# Patient Record
Sex: Female | Born: 1985 | Race: White | Hispanic: No | Smoking: Current some day smoker
Health system: Southern US, Community
[De-identification: ages and names within clinical notes are randomized; demographics above are authoritative.]

## PROBLEM LIST (undated history)

## (undated) DIAGNOSIS — F32A Depression, unspecified: Secondary | ICD-10-CM

## (undated) DIAGNOSIS — F329 Major depressive disorder, single episode, unspecified: Secondary | ICD-10-CM

## (undated) DIAGNOSIS — K5909 Other constipation: Secondary | ICD-10-CM

## (undated) DIAGNOSIS — Z862 Personal history of diseases of the blood and blood-forming organs and certain disorders involving the immune mechanism: Secondary | ICD-10-CM

---

## 2009-05-13 ENCOUNTER — Emergency Department (HOSPITAL_BASED_OUTPATIENT_CLINIC_OR_DEPARTMENT_OTHER): Admission: EM | Admit: 2009-05-13 | Discharge: 2009-05-13 | Payer: Self-pay | Admitting: Emergency Medicine

## 2009-05-13 ENCOUNTER — Ambulatory Visit: Payer: Self-pay | Admitting: Diagnostic Radiology

## 2009-05-13 IMAGING — US US OB TRANSVAGINAL
1 series · 14 of 28 positions shown · non-contrast
Comparison: None

CLINICAL DATA: Positive pregnancy test, right lower quadrant
cramping

OBSTETRIC <14 WK US AND TRANSVAGINAL OB US
TECHNIQUE: Both transabdominal and transvaginal ultrasound
examinations were performed for complete evaluation of the
gestation as well as the maternal uterus, adnexal regions, and
pelvic cul-de-sac.

[Series 1: us ob transvaginal · 0.24mm/px · 14 of 59 slices shown]
[im 3/59]
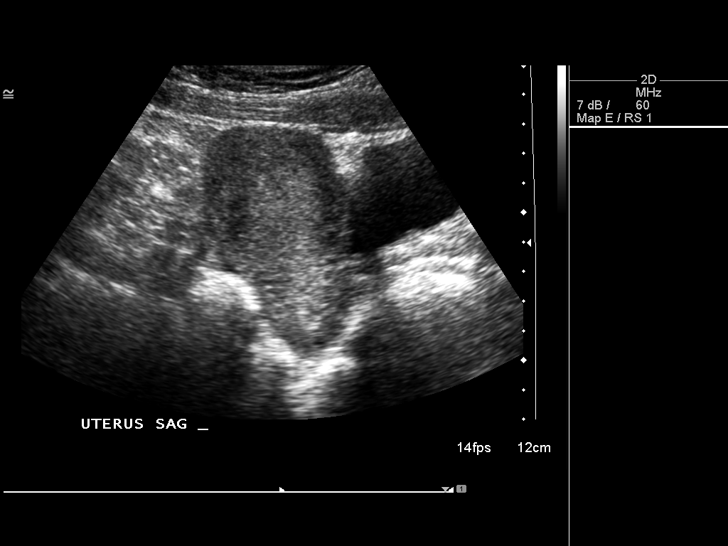
[im 7/59]
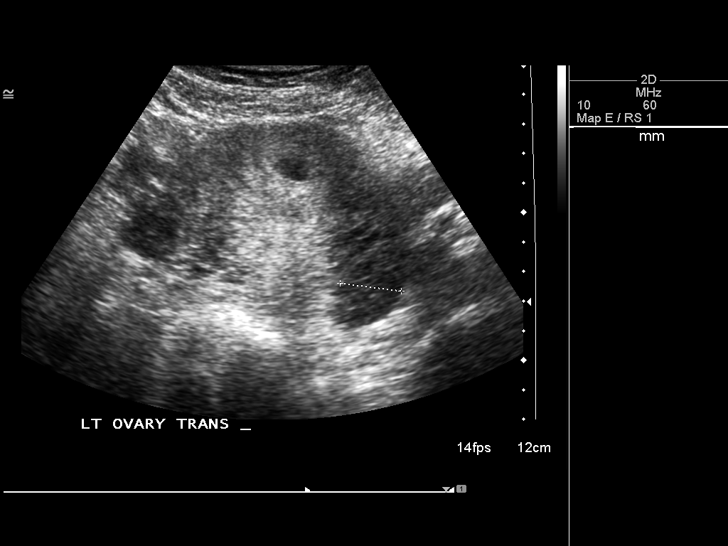
[im 11/59]
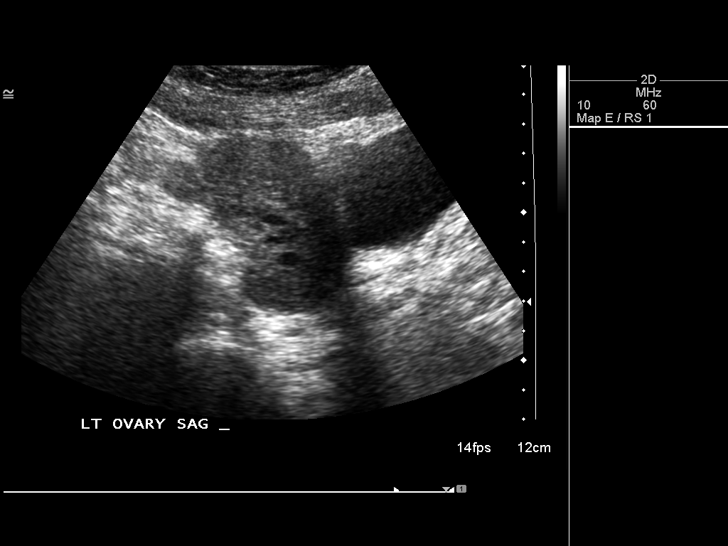
[im 16/59]
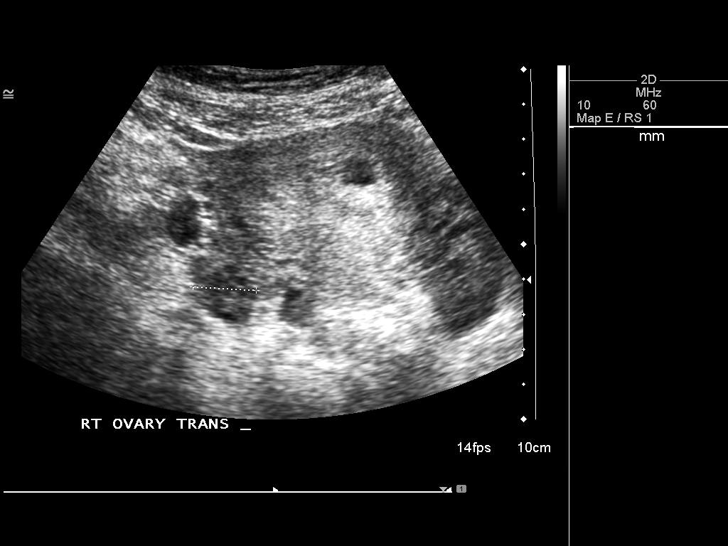
[im 20/59]
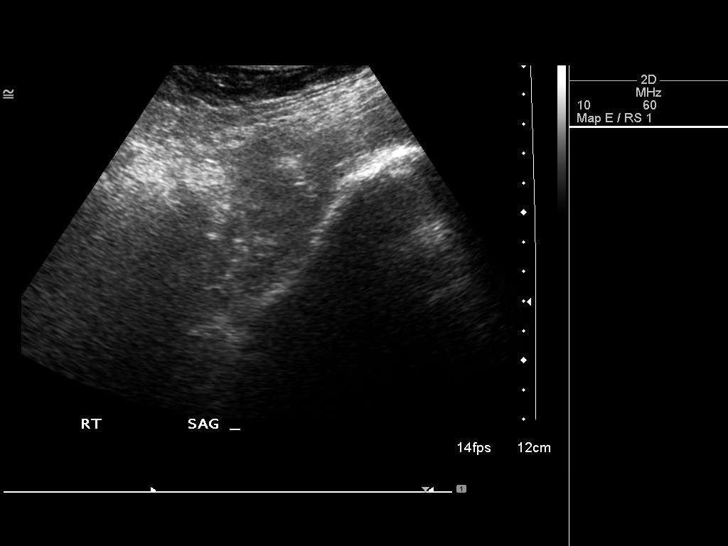
[im 24/59]
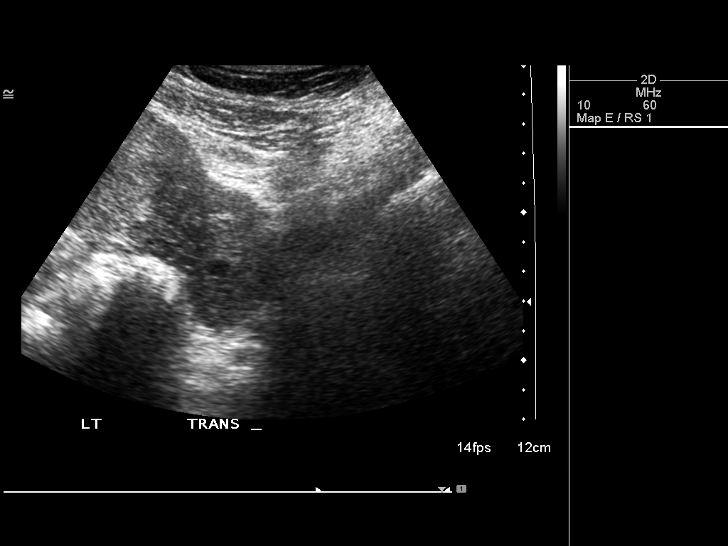
[im 28/59]
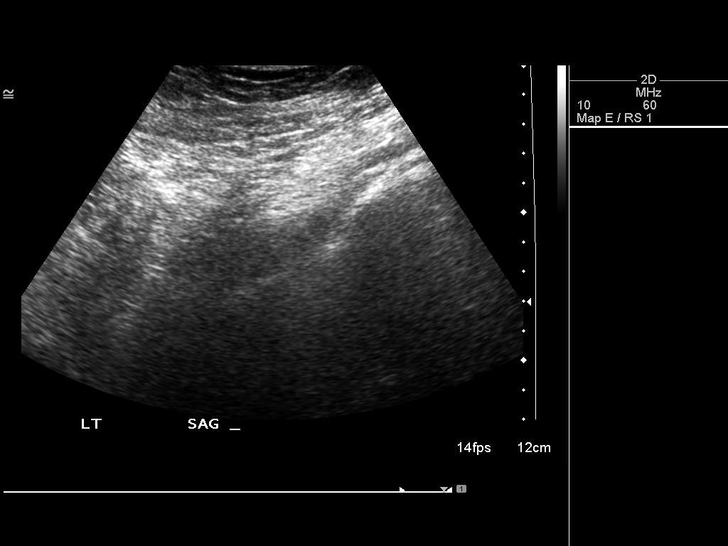
[im 33/59]
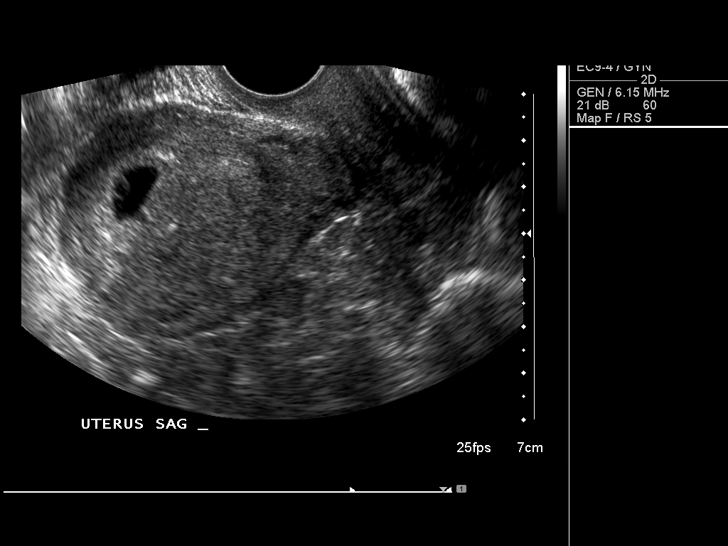
[im 37/59]
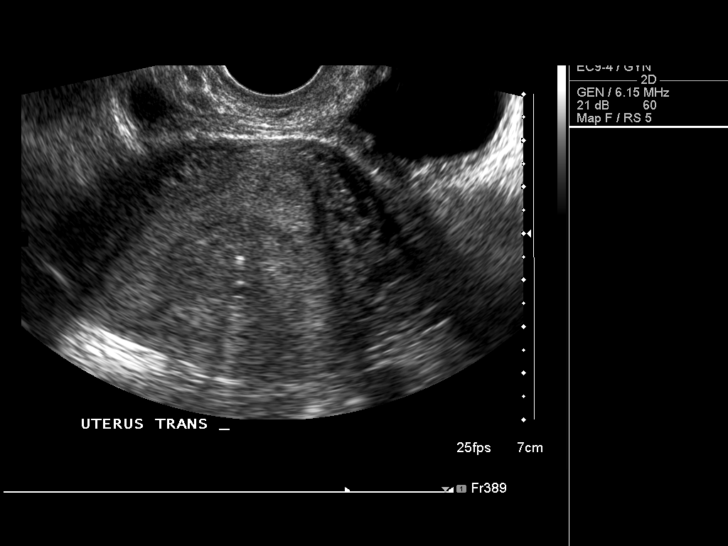
[im 41/59]
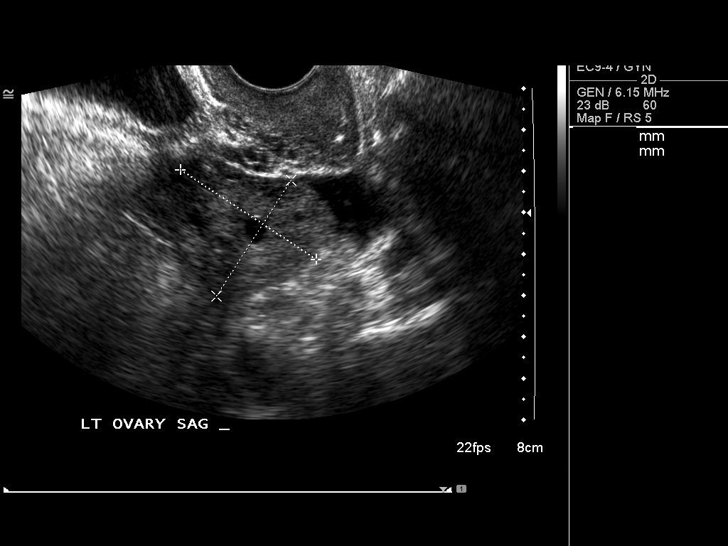
[im 46/59]
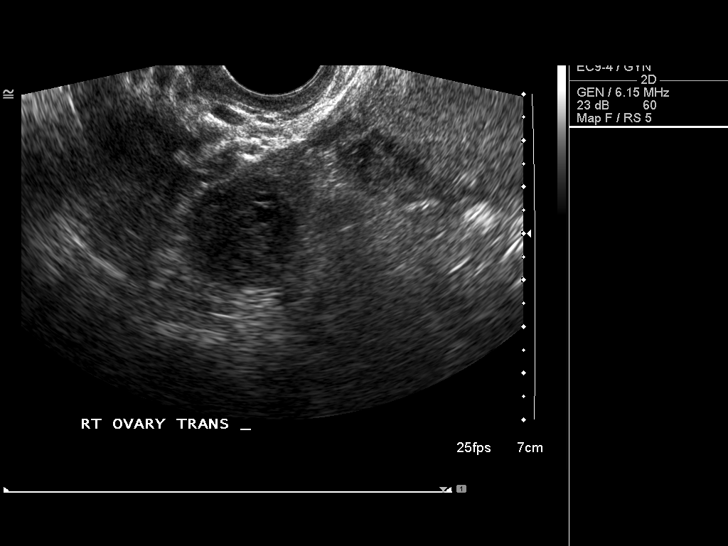
[im 50/59]
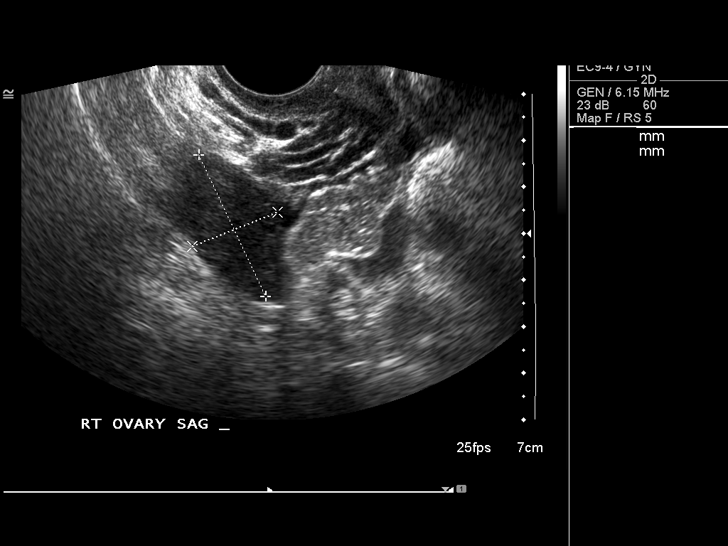
[im 54/59]
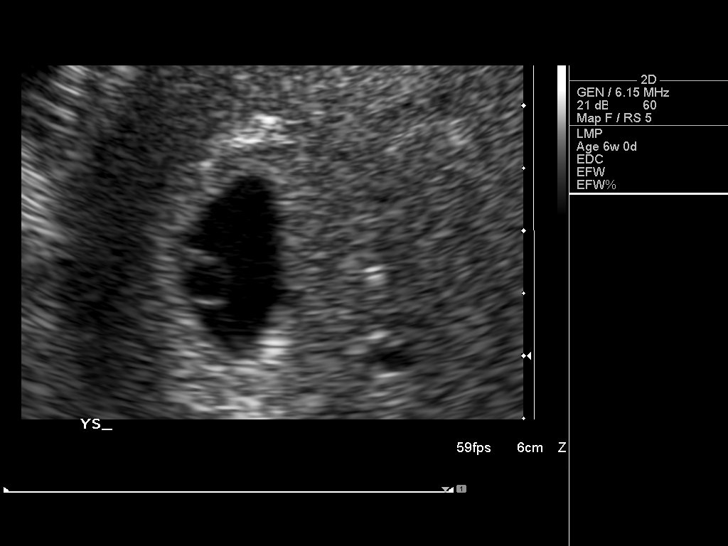
[im 59/59]
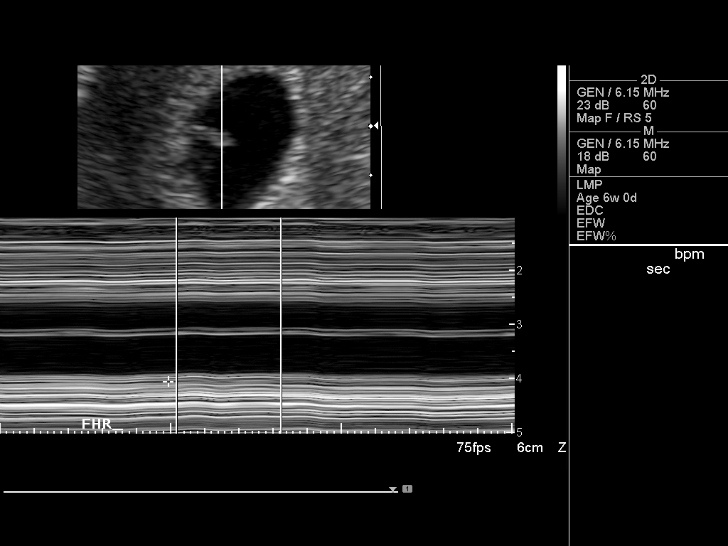

[14 of 28 positions shown; findings below may reference images not displayed]

Intrauterine gestational sac: Present
Yolk sac: Present
Embryo: Present
Cardiac Activity: Present
Heart Rate: 98 bpm

CRL: 5mm           6   w  1   d           US EDC: [DATE]

Maternal uterus/adnexae:
Trace fluid noted around the left adnexa.  Both ovaries are normal.
IMPRESSION: Intrauterine gestational sac, yolk sac, fetal pole, and cardiac
activity.  Concordant dates by LMP and crown-rump length.  No acute
finding.

## 2009-06-22 ENCOUNTER — Emergency Department (HOSPITAL_BASED_OUTPATIENT_CLINIC_OR_DEPARTMENT_OTHER): Admission: EM | Admit: 2009-06-22 | Discharge: 2009-06-22 | Payer: Self-pay | Admitting: Emergency Medicine

## 2009-07-27 DIAGNOSIS — Z862 Personal history of diseases of the blood and blood-forming organs and certain disorders involving the immune mechanism: Secondary | ICD-10-CM

## 2009-07-27 HISTORY — DX: Personal history of diseases of the blood and blood-forming organs and certain disorders involving the immune mechanism: Z86.2

## 2009-09-20 ENCOUNTER — Emergency Department (HOSPITAL_BASED_OUTPATIENT_CLINIC_OR_DEPARTMENT_OTHER): Admission: EM | Admit: 2009-09-20 | Discharge: 2009-09-20 | Payer: Self-pay | Admitting: Emergency Medicine

## 2010-10-30 LAB — CBC
Hemoglobin: 13.2 g/dL (ref 12.0–15.0)
MCHC: 36 g/dL (ref 30.0–36.0)
MCV: 98.6 fL (ref 78.0–100.0)
RBC: 3.71 MIL/uL — ABNORMAL LOW (ref 3.87–5.11)

## 2010-10-30 LAB — URINALYSIS, ROUTINE W REFLEX MICROSCOPIC
Glucose, UA: NEGATIVE mg/dL
Hgb urine dipstick: NEGATIVE
Urobilinogen, UA: 1 mg/dL (ref 0.0–1.0)
pH: 6.5 (ref 5.0–8.0)

## 2010-10-30 LAB — COMPREHENSIVE METABOLIC PANEL
ALT: 25 U/L (ref 0–35)
AST: 23 U/L (ref 0–37)
Calcium: 9.9 mg/dL (ref 8.4–10.5)
Chloride: 103 mEq/L (ref 96–112)
GFR calc non Af Amer: >60 mL/min (ref >60)
Sodium: 140 mEq/L (ref 135–145)
Total Protein: 8.1 g/dL (ref 6.0–8.3)

## 2010-10-30 LAB — LIPASE, BLOOD: Lipase: 118 U/L (ref 23–300)

## 2010-10-30 LAB — DIFFERENTIAL
Basophils Absolute: 0.4 10*3/uL — ABNORMAL HIGH (ref 0.0–0.1)
Eosinophils Relative: 1 % (ref 0–5)
Lymphocytes Relative: 23 % (ref 12–46)
Monocytes Relative: 4 % (ref 3–12)
Neutro Abs: 10.2 10*3/uL — ABNORMAL HIGH (ref 1.7–7.7)

## 2010-10-30 LAB — PREGNANCY, URINE: Preg Test, Ur: POSITIVE

## 2011-01-29 ENCOUNTER — Emergency Department (INDEPENDENT_AMBULATORY_CARE_PROVIDER_SITE_OTHER): Payer: Self-pay

## 2011-01-29 ENCOUNTER — Emergency Department (HOSPITAL_BASED_OUTPATIENT_CLINIC_OR_DEPARTMENT_OTHER)
Admission: EM | Admit: 2011-01-29 | Discharge: 2011-01-29 | Disposition: A | Discharge status: Home / Self Care | Payer: Self-pay | Attending: Emergency Medicine | Admitting: Emergency Medicine

## 2011-01-29 DIAGNOSIS — Y9372 Activity, wrestling: Secondary | ICD-10-CM | POA: Insufficient documentation

## 2011-01-29 DIAGNOSIS — L988 Other specified disorders of the skin and subcutaneous tissue: Secondary | ICD-10-CM

## 2011-01-29 DIAGNOSIS — IMO0002 Reserved for concepts with insufficient information to code with codable children: Secondary | ICD-10-CM | POA: Insufficient documentation

## 2011-01-29 DIAGNOSIS — S8000XA Contusion of unspecified knee, initial encounter: Secondary | ICD-10-CM | POA: Insufficient documentation

## 2011-01-29 DIAGNOSIS — Y92009 Unspecified place in unspecified non-institutional (private) residence as the place of occurrence of the external cause: Secondary | ICD-10-CM | POA: Insufficient documentation

## 2011-01-29 DIAGNOSIS — R609 Edema, unspecified: Secondary | ICD-10-CM

## 2011-01-29 DIAGNOSIS — W19XXXA Unspecified fall, initial encounter: Secondary | ICD-10-CM

## 2011-08-16 ENCOUNTER — Emergency Department (HOSPITAL_BASED_OUTPATIENT_CLINIC_OR_DEPARTMENT_OTHER)
Admission: EM | Admit: 2011-08-16 | Discharge: 2011-08-16 | Disposition: A | Discharge status: Home / Self Care | Payer: Self-pay | Attending: Emergency Medicine | Admitting: Emergency Medicine

## 2011-08-16 ENCOUNTER — Encounter (HOSPITAL_BASED_OUTPATIENT_CLINIC_OR_DEPARTMENT_OTHER): Payer: Self-pay | Admitting: *Deleted

## 2011-08-16 DIAGNOSIS — R197 Diarrhea, unspecified: Secondary | ICD-10-CM | POA: Insufficient documentation

## 2011-08-16 DIAGNOSIS — M549 Dorsalgia, unspecified: Secondary | ICD-10-CM | POA: Insufficient documentation

## 2011-08-16 LAB — URINALYSIS, ROUTINE W REFLEX MICROSCOPIC
Glucose, UA: NEGATIVE mg/dL
Hgb urine dipstick: NEGATIVE
Ketones, ur: NEGATIVE mg/dL
Specific Gravity, Urine: 1.031 — ABNORMAL HIGH (ref 1.005–1.030)

## 2011-08-16 LAB — PREGNANCY, URINE: Preg Test, Ur: NEGATIVE

## 2011-08-16 MED ORDER — HYOSCYAMINE SULFATE CR 0.375 MG PO CP12
0.3750 mg | ORAL_CAPSULE | Freq: Two times a day (BID) | ORAL | Status: DC | PRN
Start: 2011-08-16 — End: 2011-08-16

## 2011-08-16 MED ORDER — HYOSCYAMINE SULFATE CR 0.375 MG PO CP12: 0.3750 mg | ORAL_CAPSULE | Freq: Two times a day (BID) | ORAL | Status: DC | PRN

## 2011-08-16 NOTE — ED Provider Notes (Signed)
History     CSN: [id number]  Arrival date & time 08/16/11  1521   First MD Initiated Contact with Patient 08/16/11 1702      Chief Complaint  Patient presents with  . Diarrhea    (Consider location/radiation/quality/duration/timing/severity/associated sxs/prior treatment) Patient is a 26 y.o. female presenting with diarrhea. The history is provided by the patient. No language interpreter was used.  Diarrhea The primary symptoms include diarrhea. The illness began more than 7 days ago. The problem has been gradually worsening.  The illness is also significant for back pain. The illness does not include chills. Associated medical issues do not include inflammatory bowel disease or diverticulitis.  Pt complains of diarrhea for 2 months.  Pt does not have amedical doctor  History reviewed. No pertinent past medical history.  Past Surgical History  Procedure Date  . Hand surgery     History reviewed. No pertinent family history.  History  Substance Use Topics  . Smoking status: Current Everyday Smoker  . Smokeless tobacco: Not on file  . Alcohol Use: No    OB History    Grav Para Term Preterm Abortions TAB SAB Ect Mult Living                  Review of Systems  Constitutional: Negative for chills.  Gastrointestinal: Positive for diarrhea.  Musculoskeletal: Positive for back pain.  All other systems reviewed and are negative.    Allergies  Review of patient's allergies indicates no known allergies.  Home Medications  No current outpatient prescriptions on file.  BP 142/78  Pulse 98  Temp(Src) 98.2 F (36.8 C) (Oral)  Resp 18  Ht 5\' 7"  (1.702 m)  Wt 165 lb (74.844 kg)  BMI 25.84 kg/m2  SpO2 100%  Physical Exam  Nursing note and vitals reviewed. Constitutional: She is oriented to person, place, and time. She appears well-developed and well-nourished.  HENT:  Head: Normocephalic and atraumatic.  Right Ear: External ear normal.  Left Ear: External ear  normal.  Nose: Nose normal.  Mouth/Throat: Oropharynx is clear and moist.  Eyes: Conjunctivae and EOM are normal. Pupils are equal, round, and reactive to light.  Neck: Normal range of motion. Neck supple.  Cardiovascular: Normal rate and normal heart sounds.   Pulmonary/Chest: Effort normal and breath sounds normal.  Abdominal: Soft.  Musculoskeletal: Normal range of motion.  Neurological: She is alert and oriented to person, place, and time. She has normal reflexes.  Skin: Skin is warm.  Psychiatric: She has a normal mood and affect.    ED Course  Procedures (including critical care time)  Labs Reviewed  URINALYSIS, ROUTINE W REFLEX MICROSCOPIC - Abnormal; Notable for the following:    Specific Gravity, Urine 1.031 (*)    All other components within normal limits  PREGNANCY, URINE   No results found.   No diagnosis found.    MDM   Results for orders placed during the hospital encounter of 08/16/11  URINALYSIS, ROUTINE W REFLEX MICROSCOPIC      Component Value Range   Color, Urine YELLOW  YELLOW    APPearance CLEAR  CLEAR    Specific Gravity, Urine 1.031 (*) 1.005 - 1.030    pH 6.0  5.0 - 8.0    Glucose, UA NEGATIVE  NEGATIVE (mg/dL)   Hgb urine dipstick NEGATIVE  NEGATIVE    Bilirubin Urine NEGATIVE  NEGATIVE    Ketones, ur NEGATIVE  NEGATIVE (mg/dL)   Protein, ur NEGATIVE  NEGATIVE (mg/dL)  Urobilinogen, UA 1.0  0.0 - 1.0 (mg/dL)   Nitrite NEGATIVE  NEGATIVE    Leukocytes, UA NEGATIVE  NEGATIVE   PREGNANCY, URINE      Component Value Range   Preg Test, Ur NEGATIVE     No results found.         Langston Masker, [place] 08/16/11 1801

## 2011-08-16 NOTE — ED Provider Notes (Signed)
History/physical exam/procedure(s) were performed by non-physician practitioner and as supervising physician I was immediately available for consultation/collaboration. I have reviewed all notes and am in agreement with care and plan.   Romello Hoehn S Kaiven Vester, MD 08/16/11 2322 

## 2011-08-16 NOTE — ED Notes (Signed)
Pt states that she has had diarrhea for 2-1/2 months. Some "pressure" in lower abd. Denies dizziness or other s/s.

## 2012-07-26 LAB — OB RESULTS CONSOLE HGB/HCT, BLOOD: Hemoglobin: 12.5 g/dL

## 2012-07-26 LAB — OB RESULTS CONSOLE ABO/RH: RH Type: POSITIVE

## 2012-07-26 LAB — OB RESULTS CONSOLE HIV ANTIBODY (ROUTINE TESTING): HIV: NONREACTIVE

## 2012-07-26 LAB — OB RESULTS CONSOLE ANTIBODY SCREEN: Antibody Screen: NEGATIVE

## 2012-07-26 LAB — OB RESULTS CONSOLE GC/CHLAMYDIA: Gonorrhea: NEGATIVE

## 2012-07-26 LAB — OB RESULTS CONSOLE PLATELET COUNT: Platelets: 188 10*3/uL

## 2012-07-26 LAB — OB RESULTS CONSOLE HEPATITIS B SURFACE ANTIGEN: Hepatitis B Surface Ag: NEGATIVE

## 2012-07-27 NOTE — L&D Delivery Note (Signed)
I was present for birth and agree with fellow's note. Placenta w/ marginal/battledore insertion and cord w/ pseduoknots.  Pt w/o IV access, given 20units pitocin IM after birth.  Plans to breastfeed, interim BTL, no circumcision.   Katherine Weber [place], [place] 02/18/2013 6:51 PM

## 2012-07-27 NOTE — L&D Delivery Note (Signed)
Delivery Note At 4:54 PM a viable female was delivered via Vaginal, Spontaneous Delivery (Presentation: Left Occiput Anterior).  Nuchal x1- unable to reduce on perineum but delivered through and easily reduced after. Spontaneous cry. APGAR: 9, 9; weight TBD .   Placenta status: Intact, Spontaneous.  Cord: 3 vessels with the following complications: None.    Anesthesia: None  Episiotomy: na Lacerations: none Suture Repair: na Est. Blood Loss (mL): 400  Mom to postpartum.  Baby to nursery-stable.  Shivansh Hardaway L MD 02/18/2013, 5:15 PM

## 2012-08-31 ENCOUNTER — Encounter: Payer: Self-pay | Admitting: *Deleted

## 2012-09-02 ENCOUNTER — Encounter: Payer: Self-pay | Admitting: Advanced Practice Midwife

## 2012-09-02 ENCOUNTER — Ambulatory Visit (INDEPENDENT_AMBULATORY_CARE_PROVIDER_SITE_OTHER): Payer: Medicaid Other | Admitting: Advanced Practice Midwife

## 2012-09-02 VITALS — BP 125/80 | Wt 181.0 lb

## 2012-09-02 DIAGNOSIS — Z3480 Encounter for supervision of other normal pregnancy, unspecified trimester: Secondary | ICD-10-CM

## 2012-09-02 DIAGNOSIS — O99330 Smoking (tobacco) complicating pregnancy, unspecified trimester: Secondary | ICD-10-CM

## 2012-09-02 NOTE — Progress Notes (Signed)
p-80  Transfer from [facility]

## 2012-09-02 NOTE — Progress Notes (Signed)
   Subjective:    Katherine Weber is a [id number] [redacted]w[redacted]d being seen today for her first obstetrical visit.  Her obstetrical history is significant for smoker, NSVD x2. Patient does intend to breast feed. Pregnancy history fully reviewed, records from [facility] reviewed.  Patient reports no bleeding, no cramping and no leaking. C/o some nasal congestion.  Filed Vitals:   09/02/12 1033  BP: 125/80  Weight: 181 lb (82.101 kg)    HISTORY: OB History    Grav Para Term Preterm Abortions TAB SAB Ect Mult Living   3 2 2       2      # Outc Date GA Lbr Len/2nd Wgt Sex Del Anes PTL Lv   1 TRM 8/08 [redacted]w[redacted]d  [id number](7.82NF) M SVD None No Yes   2 TRM 6/11 [redacted]w[redacted]d  6lb15oz(3.147kg) M SVD EPI No Yes   Comments: thrombocytopenia in pregnancy   3 CUR              Past Medical History  Diagnosis Date  . Post partum depression   . Thrombocytopenia complicating pregnancy 2011   Past Surgical History  Procedure Date  . Hand surgery    Family History  Problem Relation Age of Onset  . Cancer Maternal Grandmother     Bone and lung  . Diabetes Father   . Hypertension Father   . Heart disease Father   . SIDS Brother      Exam    Uterus:     Pelvic Exam:    Perineum: Not examined   Vulva: Not examined   Vagina:  Not examined   pH:    Cervix: not examined   Adnexa: not evaluated   Bony Pelvis: proven to 6lb15oz  System: Breast:  normal appearance, no masses or tenderness,    Skin: normal coloration and turgor, no rashes    Neurologic: oriented, normal mood, no focal deficits   Extremities: ROM of all joints is normal       Mouth/Teeth mucous membranes moist, pharynx normal without lesions   Neck supple and no masses   Cardiovascular: Not examined   Respiratory:  appears well, vitals normal, no respiratory distress, acyanotic, normal RR, ear and throat exam is normal, neck free of mass or lymphadenopathy   Abdomen: soft, non-tender; bowel sounds normal; no masses,  no organomegaly   Urinary: not examined      Assessment:    Pregnancy: G3P2002 1. Supervision of normal subsequent pregnancy         Plan:     Initial labs reviewed. Prenatal vitamins. Problem list reviewed and updated. Genetic Screening done at [facility], results normal  Ultrasound discussed; fetal survey: ordered.  Follow up in 4 weeks. 50% of 30 min visit spent on counseling and coordination of care.     LEFTW[place]-KIRBY, Adasia Hoar 09/02/2012

## 2012-09-14 ENCOUNTER — Ambulatory Visit (HOSPITAL_COMMUNITY)
Admission: RE | Admit: 2012-09-14 | Discharge: 2012-09-14 | Disposition: A | Discharge status: Home / Self Care | Payer: Medicaid Other | Source: Ambulatory Visit | Attending: Advanced Practice Midwife | Admitting: Advanced Practice Midwife

## 2012-09-14 DIAGNOSIS — O358XX0 Maternal care for other (suspected) fetal abnormality and damage, not applicable or unspecified: Secondary | ICD-10-CM | POA: Insufficient documentation

## 2012-09-14 DIAGNOSIS — Z1389 Encounter for screening for other disorder: Secondary | ICD-10-CM | POA: Insufficient documentation

## 2012-09-14 DIAGNOSIS — Z363 Encounter for antenatal screening for malformations: Secondary | ICD-10-CM | POA: Insufficient documentation

## 2012-10-06 ENCOUNTER — Ambulatory Visit (INDEPENDENT_AMBULATORY_CARE_PROVIDER_SITE_OTHER): Payer: Medicaid Other | Admitting: Obstetrics & Gynecology

## 2012-10-06 VITALS — BP 138/88 | Wt 186.0 lb

## 2012-10-06 DIAGNOSIS — Z3480 Encounter for supervision of other normal pregnancy, unspecified trimester: Secondary | ICD-10-CM

## 2012-10-06 NOTE — Progress Notes (Signed)
Routine visit. Denies FM yet. No VB, ROM, CTXs. U/S shows good FM. She declines a flu vaccine. She is considering a PPS.

## 2012-10-06 NOTE — Progress Notes (Signed)
P - 103 -  Pt states she is worried that she has not felt any movement by this point - had a lot of pain/pressure in abdomen "fells rock hard and heavy" - top of vaginal area is tender - feelings are different with this pregnancy compared to previous two pregnancies.

## 2012-11-04 ENCOUNTER — Ambulatory Visit (INDEPENDENT_AMBULATORY_CARE_PROVIDER_SITE_OTHER): Payer: Medicaid Other | Admitting: Family

## 2012-11-04 ENCOUNTER — Encounter: Payer: Self-pay | Admitting: Family

## 2012-11-04 VITALS — BP 127/85 | Temp 97.5°F | Wt 188.0 lb

## 2012-11-04 DIAGNOSIS — Z3480 Encounter for supervision of other normal pregnancy, unspecified trimester: Secondary | ICD-10-CM

## 2012-11-04 DIAGNOSIS — Z3482 Encounter for supervision of other normal pregnancy, second trimester: Secondary | ICD-10-CM

## 2012-11-04 DIAGNOSIS — O4400 Placenta previa specified as without hemorrhage, unspecified trimester: Secondary | ICD-10-CM

## 2012-11-04 DIAGNOSIS — O4440 Low lying placenta NOS or without hemorrhage, unspecified trimester: Secondary | ICD-10-CM | POA: Insufficient documentation

## 2012-11-04 DIAGNOSIS — O4412 Placenta previa with hemorrhage, second trimester: Secondary | ICD-10-CM

## 2012-11-04 NOTE — Progress Notes (Signed)
No questions or concerns; reviewed ultrasound results with pt.  Hx of thrombocytopenia in pregnancy, normal this preg (188).  Return in two weeks for 1 hr GCT.  Pt considering water birth.  Discussed class requirement and conditions that prevent having one (hypertension or GDM).  Also discussed low lying placenta (2.5) and that it will not prevent her from attempting a vaginal delivery.

## 2012-11-08 ENCOUNTER — Telehealth: Payer: Self-pay | Admitting: *Deleted

## 2012-11-08 NOTE — Telephone Encounter (Signed)
Called pt to adv FMLA papers are ready to be picked up at front desk - [employer]

## 2012-11-09 ENCOUNTER — Emergency Department (HOSPITAL_BASED_OUTPATIENT_CLINIC_OR_DEPARTMENT_OTHER)
Admission: EM | Admit: 2012-11-09 | Discharge: 2012-11-09 | Disposition: A | Discharge status: Home / Self Care | Payer: Medicaid Other | Attending: Emergency Medicine | Admitting: Emergency Medicine

## 2012-11-09 ENCOUNTER — Encounter (HOSPITAL_BASED_OUTPATIENT_CLINIC_OR_DEPARTMENT_OTHER): Payer: Self-pay | Admitting: Family Medicine

## 2012-11-09 ENCOUNTER — Emergency Department (HOSPITAL_BASED_OUTPATIENT_CLINIC_OR_DEPARTMENT_OTHER): Payer: Medicaid Other

## 2012-11-09 DIAGNOSIS — Z79899 Other long term (current) drug therapy: Secondary | ICD-10-CM | POA: Insufficient documentation

## 2012-11-09 DIAGNOSIS — J40 Bronchitis, not specified as acute or chronic: Secondary | ICD-10-CM

## 2012-11-09 DIAGNOSIS — R062 Wheezing: Secondary | ICD-10-CM | POA: Insufficient documentation

## 2012-11-09 DIAGNOSIS — Z8659 Personal history of other mental and behavioral disorders: Secondary | ICD-10-CM | POA: Insufficient documentation

## 2012-11-09 DIAGNOSIS — M546 Pain in thoracic spine: Secondary | ICD-10-CM | POA: Insufficient documentation

## 2012-11-09 DIAGNOSIS — F17200 Nicotine dependence, unspecified, uncomplicated: Secondary | ICD-10-CM | POA: Insufficient documentation

## 2012-11-09 DIAGNOSIS — Z862 Personal history of diseases of the blood and blood-forming organs and certain disorders involving the immune mechanism: Secondary | ICD-10-CM | POA: Insufficient documentation

## 2012-11-09 MED ORDER — ALBUTEROL SULFATE HFA 108 (90 BASE) MCG/ACT IN AERS: 1.0000 | INHALATION_SPRAY | Freq: Four times a day (QID) | RESPIRATORY_TRACT | Status: DC | PRN

## 2012-11-09 MED ORDER — ALBUTEROL SULFATE (5 MG/ML) 0.5% IN NEBU
5.0000 mg | INHALATION_SOLUTION | Freq: Once | RESPIRATORY_TRACT | Status: AC
  Administered 2012-11-09: 5 mg via RESPIRATORY_TRACT
  Filled 2012-11-09: qty 1

## 2012-11-09 NOTE — ED Notes (Signed)
Pt c/o cough x 5 days and upper back pain with cough, pt is pregnant and due July 18th.

## 2012-11-12 NOTE — ED Provider Notes (Signed)
History    27 year old female presenting with cough and mild upper back discomfort. Gradual onset of symptoms approximately 5 days ago. She feels her cough is progressively worsening. Worse at night. No shortness of breath. No wheezing. No fevers or chills. Has been taking cough drops with no relief. No unusual leg pain or swelling. No significant past medical history. Patient is partially 6 months pregnant.   CSN: [id number]  Arrival date & time 11/09/12  1201   First MD Initiated Contact with Patient 11/09/12 1314      Chief Complaint  Patient presents with  . Cough    (Consider location/radiation/quality/duration/timing/severity/associated sxs/prior treatment) HPI  Past Medical History  Diagnosis Date  . Post partum depression   . Thrombocytopenia complicating pregnancy 2011    Past Surgical History  Procedure Laterality Date  . Hand surgery      Family History  Problem Relation Age of Onset  . Cancer Maternal Grandmother     Bone and lung  . Diabetes Father   . Hypertension Father   . Heart disease Father   . SIDS Brother     History  Substance Use Topics  . Smoking status: Current Every Day Smoker -- 0.50 packs/day for 15 years    Types: Cigarettes  . Smokeless tobacco: Never Used  . Alcohol Use: No    OB History   Grav Para Term Preterm Abortions TAB SAB Ect Mult Living   3 2 2       2       Review of Systems  All systems reviewed and negative, other than as noted in HPI.   Allergies  Review of patient's allergies indicates no known allergies.  Home Medications   Current Outpatient Rx  Name  Route  Sig  Dispense  Refill  . albuterol (PROVENTIL HFA;VENTOLIN HFA) 108 (90 BASE) MCG/ACT inhaler   Inhalation   Inhale 1-2 puffs into the lungs every 6 (six) hours as needed for wheezing.   1 Inhaler   0   . EXPIRED: hyoscyamine (LEVSINEX) 0.375 MG 12 hr capsule   Oral   Take 1 capsule (0.375 mg total) by mouth 2 (two) times daily as needed for  cramping.   20 capsule   0   . prenatal vitamin w/FE, FA (PRENATAL 1 + 1) 27-1 MG TABS   Oral   Take 1 tablet by mouth daily.           BP 120/69  Pulse 90  Temp(Src) 98 F (36.7 C)  Resp 16  Ht 5\' 7"  (1.702 m)  Wt 188 lb (85.276 kg)  BMI 29.44 kg/m2  SpO2 99%  LMP 05/06/2012  Physical Exam  Nursing note and vitals reviewed. Constitutional: She appears well-developed and well-nourished. No distress.  HENT:  Head: Normocephalic and atraumatic.  Eyes: Conjunctivae are normal. Right eye exhibits no discharge. Left eye exhibits no discharge.  Neck: Neck supple.  Cardiovascular: Normal rate, regular rhythm and normal heart sounds.  Exam reveals no gallop and no friction rub.   No murmur heard. Pulmonary/Chest: Effort normal. No respiratory distress. She has wheezes.  Faint expiratory wheezing.  Abdominal: Soft. She exhibits no distension. There is no tenderness.  Musculoskeletal: She exhibits no edema and no tenderness.  Lower extremities symmetric as compared to each other. No calf tenderness. Negative Homan's. No palpable cords.   Neurological: She is alert.  Skin: Skin is warm and dry.  Psychiatric: She has a normal mood and affect. Her behavior is normal. Thought content  normal.    ED Course  Procedures (including critical care time)  Labs Reviewed - No data to display No results found.  Dg Chest 2 View  11/09/2012  *RADIOLOGY REPORT*  Clinical Data: Cough and expiratory wheezing.  CHEST - 2 VIEW  Comparison: None.  Findings: The lungs are clear.  Heart size is normal.  No pneumothorax or pleural effusion.  IMPRESSION: Negative chest.   Original Report Authenticated By: Holley Dexter, M.D.     1. Bronchitis       MDM  27 year old female with cough. Consistent with bronchitis. Mild wheezing on exam, but no increased work of breathing. Oxygen saturation is good on room air. Likely viral in etiology. Indication for antibiotics. Chest x-ray is clear. Doubt PE.  When necessary albuterol. Patient encouraged to quit smoking.        Raeford Razor, MD 11/12/12 [id number]

## 2012-11-18 ENCOUNTER — Encounter: Payer: Self-pay | Admitting: *Deleted

## 2012-11-18 ENCOUNTER — Ambulatory Visit (INDEPENDENT_AMBULATORY_CARE_PROVIDER_SITE_OTHER): Payer: Medicaid Other | Admitting: Advanced Practice Midwife

## 2012-11-18 ENCOUNTER — Other Ambulatory Visit: Payer: Self-pay | Admitting: *Deleted

## 2012-11-18 ENCOUNTER — Encounter: Payer: Self-pay | Admitting: Advanced Practice Midwife

## 2012-11-18 VITALS — BP 125/78 | Temp 96.8°F | Wt 188.0 lb

## 2012-11-18 DIAGNOSIS — Z3482 Encounter for supervision of other normal pregnancy, second trimester: Secondary | ICD-10-CM

## 2012-11-18 DIAGNOSIS — M940 Chondrocostal junction syndrome [Tietze]: Secondary | ICD-10-CM

## 2012-11-18 DIAGNOSIS — N644 Mastodynia: Secondary | ICD-10-CM

## 2012-11-18 DIAGNOSIS — Z3480 Encounter for supervision of other normal pregnancy, unspecified trimester: Secondary | ICD-10-CM

## 2012-11-18 LAB — CBC
MCH: 34.1 pg — ABNORMAL HIGH (ref 26.0–34.0)
MCHC: 34.5 g/dL (ref 30.0–36.0)
Platelets: 180 10*3/uL (ref 150–400)
RBC: 2.87 MIL/uL — ABNORMAL LOW (ref 3.87–5.11)

## 2012-11-18 MED ORDER — CYCLOBENZAPRINE HCL 10 MG PO TABS
10.0000 mg | ORAL_TABLET | Freq: Three times a day (TID) | ORAL | Status: DC | PRN
Start: 2012-11-18 — End: 2013-02-18

## 2012-11-18 MED ORDER — NYSTATIN-TRIAMCINOLONE 100000-0.1 UNIT/GM-% EX CREA: TOPICAL_CREAM | Freq: Two times a day (BID) | CUTANEOUS | Status: DC

## 2012-11-18 MED ORDER — CETIRIZINE HCL 10 MG PO TABS: 10.0000 mg | ORAL_TABLET | Freq: Every day | ORAL | Status: DC

## 2012-11-18 NOTE — Patient Instructions (Addendum)
Pregnancy - Third Trimester The third trimester of pregnancy (the last 3 months) is a period of the most rapid growth for you and your baby. The baby approaches a length of 20 inches and a weight of 6 to 10 pounds. The baby is adding on fat and getting ready for life outside your body. While inside, babies have periods of sleeping and waking, suck their thumbs, and hiccups. You can often feel small contractions of the uterus. This is false labor. It is also called Braxton-Hicks contractions. This is like a practice for labor. The usual problems in this stage of pregnancy include more difficulty breathing, swelling of the hands and feet from water retention, and having to urinate more often because of the uterus and baby pressing on your bladder.  PRENATAL EXAMS  Blood work may continue to be done during prenatal exams. These tests are done to check on your health and the probable health of your baby. Blood work is used to follow your blood levels (hemoglobin). Anemia (low hemoglobin) is common during pregnancy. Iron and vitamins are given to help prevent this. You may also continue to be checked for diabetes. Some of the past blood tests may be done again.  The size of the uterus is measured during each visit. This makes sure your baby is growing properly according to your pregnancy dates.  Your blood pressure is checked every prenatal visit. This is to make sure you are not getting toxemia.  Your urine is checked every prenatal visit for infection, diabetes and protein.  Your weight is checked at each visit. This is done to make sure gains are happening at the suggested rate and that you and your baby are growing normally.  Sometimes, an ultrasound is performed to confirm the position and the proper growth and development of the baby. This is a test done that bounces harmless sound waves off the baby so your caregiver can more accurately determine due dates.  Discuss the type of pain medication and  anesthesia you will have during your labor and delivery.  Discuss the possibility and anesthesia if a Cesarean Section might be necessary.  Inform your caregiver if there is any mental or physical violence at home. Sometimes, a specialized non-stress test, contraction stress test and biophysical profile are done to make sure the baby is not having a problem. Checking the amniotic fluid surrounding the baby is called an amniocentesis. The amniotic fluid is removed by sticking a needle into the belly (abdomen). This is sometimes done near the end of pregnancy if an early delivery is required. In this case, it is done to help make sure the baby's lungs are mature enough for the baby to live outside of the womb. If the lungs are not mature and it is unsafe to deliver the baby, an injection of cortisone medication is given to the mother 1 to 2 days before the delivery. This helps the baby's lungs mature and makes it safer to deliver the baby. CHANGES OCCURING IN THE THIRD TRIMESTER OF PREGNANCY Your body goes through many changes during pregnancy. They vary from person to person. Talk to your caregiver about changes you notice and are concerned about.  During the last trimester, you have probably had an increase in your appetite. It is normal to have cravings for certain foods. This varies from person to person and pregnancy to pregnancy.  You may begin to get stretch marks on your hips, abdomen, and breasts. These are normal changes in the body  during pregnancy. There are no exercises or medications to take which prevent this change.  Constipation may be treated with a stool softener or adding bulk to your diet. Drinking lots of fluids, fiber in vegetables, fruits, and whole grains are helpful.  Exercising is also helpful. If you have been very active up until your pregnancy, most of these activities can be continued during your pregnancy. If you have been less active, it is helpful to start an exercise  program such as walking. Consult your caregiver before starting exercise programs.  Avoid all smoking, alcohol, un-prescribed drugs, herbs and "street drugs" during your pregnancy. These chemicals affect the formation and growth of the baby. Avoid chemicals throughout the pregnancy to ensure the delivery of a healthy infant.  Backache, varicose veins and hemorrhoids may develop or get worse.  You will tire more easily in the third trimester, which is normal.  The baby's movements may be stronger and more often.  You may become short of breath easily.  Your belly button may stick out.  A yellow discharge may leak from your breasts called colostrum.  You may have a bloody mucus discharge. This usually occurs a few days to a week before labor begins. HOME CARE INSTRUCTIONS   Keep your caregiver's appointments. Follow your caregiver's instructions regarding medication use, exercise, and diet.  During pregnancy, you are providing food for you and your baby. Continue to eat regular, well-balanced meals. Choose foods such as meat, fish, milk and other low fat dairy products, vegetables, fruits, and whole-grain breads and cereals. Your caregiver will tell you of the ideal weight gain.  A physical sexual relationship may be continued throughout pregnancy if there are no other problems such as early (premature) leaking of amniotic fluid from the membranes, vaginal bleeding, or belly (abdominal) pain.  Exercise regularly if there are no restrictions. Check with your caregiver if you are unsure of the safety of your exercises. Greater weight gain will occur in the last 2 trimesters of pregnancy. Exercising helps:  Control your weight.  Get you in shape for labor and delivery.  You lose weight after you deliver.  Rest a lot with legs elevated, or as needed for leg cramps or low back pain.  Wear a good support or jogging bra for breast tenderness during pregnancy. This may help if worn during  sleep. Pads or tissues may be used in the bra if you are leaking colostrum.  Do not use hot tubs, steam rooms, or saunas.  Wear your seat belt when driving. This protects you and your baby if you are in an accident.  Avoid raw meat, cat litter boxes and soil used by cats. These carry germs that can cause birth defects in the baby.  It is easier to loose urine during pregnancy. Tightening up and strengthening the pelvic muscles will help with this problem. You can practice stopping your urination while you are going to the bathroom. These are the same muscles you need to strengthen. It is also the muscles you would use if you were trying to stop from passing gas. You can practice tightening these muscles up 10 times a set and repeating this about 3 times per day. Once you know what muscles to tighten up, do not perform these exercises during urination. It is more likely to cause an infection by backing up the urine.  Ask for help if you have financial, counseling or nutritional needs during pregnancy. Your caregiver will be able to offer counseling for these  needs as well as refer you for other special needs.  Make a list of emergency phone numbers and have them available.  Plan on getting help from family or friends when you go home from the hospital.  Make a trial run to the hospital.  Take prenatal classes with the father to understand, practice and ask questions about the labor and delivery.  Prepare the baby's room/nursery.  Do not travel out of the city unless it is absolutely necessary and with the advice of your caregiver.  Wear only low or no heal shoes to have better balance and prevent falling. MEDICATIONS AND DRUG USE IN PREGNANCY  Take prenatal vitamins as directed. The vitamin should contain 1 milligram of folic acid. Keep all vitamins out of reach of children. Only a couple vitamins or tablets containing iron may be fatal to a baby or young child when ingested.  Avoid use  of all medications, including herbs, over-the-counter medications, not prescribed or suggested by your caregiver. Only take over-the-counter or prescription medicines for pain, discomfort, or fever as directed by your caregiver. Do not use aspirin, ibuprofen (Motrin, Advil, Nuprin) or naproxen (Aleve) unless OK'd by your caregiver.  Let your caregiver also know about herbs you may be using.  Alcohol is related to a number of birth defects. This includes fetal alcohol syndrome. All alcohol, in any form, should be avoided completely. Smoking will cause low birth rate and premature babies.  Street/illegal drugs are very harmful to the baby. They are absolutely forbidden. A baby born to an addicted mother will be addicted at birth. The baby will go through the same withdrawal an adult does. SEEK MEDICAL CARE IF: You have any concerns or worries during your pregnancy. It is better to call with your questions if you feel they cannot wait, rather than worry about them. DECISIONS ABOUT CIRCUMCISION You may or may not know the sex of your baby. If you know your baby is a boy, it may be time to think about circumcision. Circumcision is the removal of the foreskin of the penis. This is the skin that covers the sensitive end of the penis. There is no proven medical need for this. Often this decision is made on what is popular at the time or based upon religious beliefs and social issues. You can discuss these issues with your caregiver or pediatrician. SEEK IMMEDIATE MEDICAL CARE IF:   An unexplained oral temperature above 102 F (38.9 C) develops, or as your caregiver suggests.  You have leaking of fluid from the vagina (birth canal). If leaking membranes are suspected, take your temperature and tell your caregiver of this when you call.  There is vaginal spotting, bleeding or passing clots. Tell your caregiver of the amount and how many pads are used.  You develop a bad smelling vaginal discharge with  a change in the color from clear to white.  You develop vomiting that lasts more than 24 hours.  You develop chills or fever.  You develop shortness of breath.  You develop burning on urination.  You loose more than 2 pounds of weight or gain more than 2 pounds of weight or as suggested by your caregiver.  You notice sudden swelling of your face, hands, and feet or legs.  You develop belly (abdominal) pain. Round ligament discomfort is a common non-cancerous (benign) cause of abdominal pain in pregnancy. Your caregiver still must evaluate you.  You develop a severe headache that does not go away.  You develop visual  problems, blurred or double vision.  If you have not felt your baby move for more than 1 hour. If you think the baby is not moving as much as usual, eat something with sugar in it and lie down on your left side for an hour. The baby should move at least 4 to 5 times per hour. Call right away if your baby moves less than that.  You fall, are in a car accident or any kind of trauma.  There is mental or physical violence at home. Document Released: 07/07/2001 Document Revised: 10/05/2011 Document Reviewed: 01/09/2009 [facility] Patient Information 2013 [place], [place]. Costochondritis Costochondritis (Tietze syndrome), or costochondral separation, is a swelling and irritation (inflammation) of the tissue (cartilage) that connects your ribs with your breastbone (sternum). It may occur on its own (spontaneously), through damage caused by an accident (trauma), or simply from coughing or minor exercise. It may take up to 6 weeks to get better and longer if you are unable to be conservative in your activities. HOME CARE INSTRUCTIONS   Avoid exhausting physical activity. Try not to strain your ribs during normal activity. This would include any activities using chest, belly (abdominal), and side muscles, especially if heavy weights are used.  Use ice for 15 to 20 minutes per hour  while awake for the first 2 days. Place the ice in a plastic bag, and place a towel between the bag of ice and your skin.  Only take over-the-counter or prescription medicines for pain, discomfort, or fever as directed by your caregiver. SEEK IMMEDIATE MEDICAL CARE IF:   Your pain increases or you are very uncomfortable.  You have a fever.  You develop difficulty with your breathing.  You cough up blood.  You develop worse chest pains, shortness of breath, sweating, or vomiting.  You develop new, unexplained problems (symptoms). MAKE SURE YOU:   Understand these instructions.  Will watch your condition.  Will get help right away if you are not doing well or get worse. Document Released: 04/22/2005 Document Revised: 10/05/2011 Document Reviewed: 02/29/2008 [facility] Patient Information 2013 [place], [place].

## 2012-11-18 NOTE — Progress Notes (Signed)
Several complaints. Nipples have been red and itchy.  Will try some Mycolog II cream. Warned to not use longer than 2 wks due to risk of skin thinning. Also c/o tender ribs in left mid back. Has had URI with coughing, suspect costochondritis with spasm. Will try Flexeril.  Also, needs note for hours restriction to no more than 8 per day.

## 2012-11-19 LAB — GLUCOSE TOLERANCE, 1 HOUR (50G) W/O FASTING: Glucose, 1 Hour GTT: 111 mg/dL (ref 70–140)

## 2012-11-19 LAB — RPR

## 2012-11-21 ENCOUNTER — Telehealth: Payer: Self-pay | Admitting: *Deleted

## 2012-11-21 NOTE — Telephone Encounter (Signed)
Lm on voicemail that 1 hr GTT was normal but would suggest adding Ferrous Sulfate 325 mg and Colace daily due to hgb of 9.8.

## 2012-12-02 ENCOUNTER — Ambulatory Visit (INDEPENDENT_AMBULATORY_CARE_PROVIDER_SITE_OTHER): Payer: Medicaid Other | Admitting: Obstetrics and Gynecology

## 2012-12-02 ENCOUNTER — Encounter: Payer: Self-pay | Admitting: Obstetrics and Gynecology

## 2012-12-02 VITALS — BP 120/77 | Wt 185.0 lb

## 2012-12-02 DIAGNOSIS — M549 Dorsalgia, unspecified: Secondary | ICD-10-CM

## 2012-12-02 DIAGNOSIS — Z3480 Encounter for supervision of other normal pregnancy, unspecified trimester: Secondary | ICD-10-CM

## 2012-12-02 NOTE — Progress Notes (Signed)
[phone] 

## 2012-12-02 NOTE — Progress Notes (Signed)
Backache discussed. Flexeril not helping. Try maternity belt and abdominal tightening. Niples better. Good FM.

## 2012-12-02 NOTE — Patient Instructions (Signed)
Pregnancy - Third Trimester  The third trimester of pregnancy (the last 3 months) is a period of the most rapid growth for you and your baby. The baby approaches a length of 20 inches and a weight of 6 to 10 pounds. The baby is adding on fat and getting ready for life outside your body. While inside, babies have periods of sleeping and waking, suck their thumbs, and hiccups. You can often feel small contractions of the uterus. This is false labor. It is also called Braxton-Hicks contractions. This is like a practice for labor. The usual problems in this stage of pregnancy include more difficulty breathing, swelling of the hands and feet from water retention, and having to urinate more often because of the uterus and baby pressing on your bladder.   PRENATAL EXAMS  · Blood work may continue to be done during prenatal exams. These tests are done to check on your health and the probable health of your baby. Blood work is used to follow your blood levels (hemoglobin). Anemia (low hemoglobin) is common during pregnancy. Iron and vitamins are given to help prevent this. You may also continue to be checked for diabetes. Some of the past blood tests may be done again.  · The size of the uterus is measured during each visit. This makes sure your baby is growing properly according to your pregnancy dates.  · Your blood pressure is checked every prenatal visit. This is to make sure you are not getting toxemia.  · Your urine is checked every prenatal visit for infection, diabetes and protein.  · Your weight is checked at each visit. This is done to make sure gains are happening at the suggested rate and that you and your baby are growing normally.  · Sometimes, an ultrasound is performed to confirm the position and the proper growth and development of the baby. This is a test done that bounces harmless sound waves off the baby so your caregiver can more accurately determine due dates.  · Discuss the type of pain medication and  anesthesia you will have during your labor and delivery.  · Discuss the possibility and anesthesia if a Cesarean Section might be necessary.  · Inform your caregiver if there is any mental or physical violence at home.  Sometimes, a specialized non-stress test, contraction stress test and biophysical profile are done to make sure the baby is not having a problem. Checking the amniotic fluid surrounding the baby is called an amniocentesis. The amniotic fluid is removed by sticking a needle into the belly (abdomen). This is sometimes done near the end of pregnancy if an early delivery is required. In this case, it is done to help make sure the baby's lungs are mature enough for the baby to live outside of the womb. If the lungs are not mature and it is unsafe to deliver the baby, an injection of cortisone medication is given to the mother 1 to 2 days before the delivery. This helps the baby's lungs mature and makes it safer to deliver the baby.  CHANGES OCCURING IN THE THIRD TRIMESTER OF PREGNANCY  Your body goes through many changes during pregnancy. They vary from person to person. Talk to your caregiver about changes you notice and are concerned about.  · During the last trimester, you have probably had an increase in your appetite. It is normal to have cravings for certain foods. This varies from person to person and pregnancy to pregnancy.  · You may begin to   get stretch marks on your hips, abdomen, and breasts. These are normal changes in the body during pregnancy. There are no exercises or medications to take which prevent this change.  · Constipation may be treated with a stool softener or adding bulk to your diet. Drinking lots of fluids, fiber in vegetables, fruits, and whole grains are helpful.  · Exercising is also helpful. If you have been very active up until your pregnancy, most of these activities can be continued during your pregnancy. If you have been less active, it is helpful to start an exercise  program such as walking. Consult your caregiver before starting exercise programs.  · Avoid all smoking, alcohol, un-prescribed drugs, herbs and "street drugs" during your pregnancy. These chemicals affect the formation and growth of the baby. Avoid chemicals throughout the pregnancy to ensure the delivery of a healthy infant.  · Backache, varicose veins and hemorrhoids may develop or get worse.  · You will tire more easily in the third trimester, which is normal.  · The baby's movements may be stronger and more often.  · You may become short of breath easily.  · Your belly button may stick out.  · A yellow discharge may leak from your breasts called colostrum.  · You may have a bloody mucus discharge. This usually occurs a few days to a week before labor begins.  HOME CARE INSTRUCTIONS   · Keep your caregiver's appointments. Follow your caregiver's instructions regarding medication use, exercise, and diet.  · During pregnancy, you are providing food for you and your baby. Continue to eat regular, well-balanced meals. Choose foods such as meat, fish, milk and other low fat dairy products, vegetables, fruits, and whole-grain breads and cereals. Your caregiver will tell you of the ideal weight gain.  · A physical sexual relationship may be continued throughout pregnancy if there are no other problems such as early (premature) leaking of amniotic fluid from the membranes, vaginal bleeding, or belly (abdominal) pain.  · Exercise regularly if there are no restrictions. Check with your caregiver if you are unsure of the safety of your exercises. Greater weight gain will occur in the last 2 trimesters of pregnancy. Exercising helps:  · Control your weight.  · Get you in shape for labor and delivery.  · You lose weight after you deliver.  · Rest a lot with legs elevated, or as needed for leg cramps or low back pain.  · Wear a good support or jogging bra for breast tenderness during pregnancy. This may help if worn during  sleep. Pads or tissues may be used in the bra if you are leaking colostrum.  · Do not use hot tubs, steam rooms, or saunas.  · Wear your seat belt when driving. This protects you and your baby if you are in an accident.  · Avoid raw meat, cat litter boxes and soil used by cats. These carry germs that can cause birth defects in the baby.  · It is easier to loose urine during pregnancy. Tightening up and strengthening the pelvic muscles will help with this problem. You can practice stopping your urination while you are going to the bathroom. These are the same muscles you need to strengthen. It is also the muscles you would use if you were trying to stop from passing gas. You can practice tightening these muscles up 10 times a set and repeating this about 3 times per day. Once you know what muscles to tighten up, do not perform these   exercises during urination. It is more likely to cause an infection by backing up the urine.  · Ask for help if you have financial, counseling or nutritional needs during pregnancy. Your caregiver will be able to offer counseling for these needs as well as refer you for other special needs.  · Make a list of emergency phone numbers and have them available.  · Plan on getting help from family or friends when you go home from the hospital.  · Make a trial run to the hospital.  · Take prenatal classes with the father to understand, practice and ask questions about the labor and delivery.  · Prepare the baby's room/nursery.  · Do not travel out of the city unless it is absolutely necessary and with the advice of your caregiver.  · Wear only low or no heal shoes to have better balance and prevent falling.  MEDICATIONS AND DRUG USE IN PREGNANCY  · Take prenatal vitamins as directed. The vitamin should contain 1 milligram of folic acid. Keep all vitamins out of reach of children. Only a couple vitamins or tablets containing iron may be fatal to a baby or young child when ingested.  · Avoid use  of all medications, including herbs, over-the-counter medications, not prescribed or suggested by your caregiver. Only take over-the-counter or prescription medicines for pain, discomfort, or fever as directed by your caregiver. Do not use aspirin, ibuprofen (Motrin®, Advil®, Nuprin®) or naproxen (Aleve®) unless OK'd by your caregiver.  · Let your caregiver also know about herbs you may be using.  · Alcohol is related to a number of birth defects. This includes fetal alcohol syndrome. All alcohol, in any form, should be avoided completely. Smoking will cause low birth rate and premature babies.  · Street/illegal drugs are very harmful to the baby. They are absolutely forbidden. A baby born to an addicted mother will be addicted at birth. The baby will go through the same withdrawal an adult does.  SEEK MEDICAL CARE IF:  You have any concerns or worries during your pregnancy. It is better to call with your questions if you feel they cannot wait, rather than worry about them.  DECISIONS ABOUT CIRCUMCISION  You may or may not know the sex of your baby. If you know your baby is a boy, it may be time to think about circumcision. Circumcision is the removal of the foreskin of the penis. This is the skin that covers the sensitive end of the penis. There is no proven medical need for this. Often this decision is made on what is popular at the time or based upon religious beliefs and social issues. You can discuss these issues with your caregiver or pediatrician.  SEEK IMMEDIATE MEDICAL CARE IF:   · An unexplained oral temperature above 102° F (38.9° C) develops, or as your caregiver suggests.  · You have leaking of fluid from the vagina (birth canal). If leaking membranes are suspected, take your temperature and tell your caregiver of this when you call.  · There is vaginal spotting, bleeding or passing clots. Tell your caregiver of the amount and how many pads are used.  · You develop a bad smelling vaginal discharge with  a change in the color from clear to white.  · You develop vomiting that lasts more than 24 hours.  · You develop chills or fever.  · You develop shortness of breath.  · You develop burning on urination.  · You loose more than 2 pounds of weight   or gain more than 2 pounds of weight or as suggested by your caregiver.  · You notice sudden swelling of your face, hands, and feet or legs.  · You develop belly (abdominal) pain. Round ligament discomfort is a common non-cancerous (benign) cause of abdominal pain in pregnancy. Your caregiver still must evaluate you.  · You develop a severe headache that does not go away.  · You develop visual problems, blurred or double vision.  · If you have not felt your baby move for more than 1 hour. If you think the baby is not moving as much as usual, eat something with sugar in it and lie down on your left side for an hour. The baby should move at least 4 to 5 times per hour. Call right away if your baby moves less than that.  · You fall, are in a car accident or any kind of trauma.  · There is mental or physical violence at home.  Document Released: 07/07/2001 Document Revised: 10/05/2011 Document Reviewed: 01/09/2009  [employer]® Patient Information ©2013 [employer].

## 2012-12-16 ENCOUNTER — Ambulatory Visit (INDEPENDENT_AMBULATORY_CARE_PROVIDER_SITE_OTHER): Payer: Medicaid Other | Admitting: Family

## 2012-12-16 VITALS — BP 119/76 | Wt 187.0 lb

## 2012-12-16 DIAGNOSIS — Z3482 Encounter for supervision of other normal pregnancy, second trimester: Secondary | ICD-10-CM

## 2012-12-16 DIAGNOSIS — Z3480 Encounter for supervision of other normal pregnancy, unspecified trimester: Secondary | ICD-10-CM

## 2012-12-16 NOTE — Progress Notes (Signed)
Questions regarding cramping after intercourse; no bleeding; explained normal due to prostaglandins.  Reviewed 1 hr results.

## 2012-12-16 NOTE — Progress Notes (Signed)
p=96 

## 2012-12-30 ENCOUNTER — Encounter: Payer: Self-pay | Admitting: Advanced Practice Midwife

## 2012-12-30 ENCOUNTER — Ambulatory Visit (INDEPENDENT_AMBULATORY_CARE_PROVIDER_SITE_OTHER): Payer: Medicaid Other | Admitting: Advanced Practice Midwife

## 2012-12-30 VITALS — BP 120/79 | Temp 97.2°F | Wt 186.0 lb

## 2012-12-30 DIAGNOSIS — N949 Unspecified condition associated with female genital organs and menstrual cycle: Secondary | ICD-10-CM

## 2012-12-30 DIAGNOSIS — Z3480 Encounter for supervision of other normal pregnancy, unspecified trimester: Secondary | ICD-10-CM

## 2012-12-30 NOTE — Progress Notes (Signed)
Doing well. Occasional side cramps along round ligament.

## 2012-12-30 NOTE — Patient Instructions (Addendum)

## 2012-12-30 NOTE — Progress Notes (Signed)
p=96 

## 2013-01-13 ENCOUNTER — Ambulatory Visit (INDEPENDENT_AMBULATORY_CARE_PROVIDER_SITE_OTHER): Payer: Medicaid Other | Admitting: Advanced Practice Midwife

## 2013-01-13 ENCOUNTER — Encounter: Payer: Self-pay | Admitting: Advanced Practice Midwife

## 2013-01-13 VITALS — BP 119/78 | Wt 186.0 lb

## 2013-01-13 DIAGNOSIS — Z3480 Encounter for supervision of other normal pregnancy, unspecified trimester: Secondary | ICD-10-CM

## 2013-01-13 DIAGNOSIS — Z3483 Encounter for supervision of other normal pregnancy, third trimester: Secondary | ICD-10-CM

## 2013-01-13 DIAGNOSIS — Z23 Encounter for immunization: Secondary | ICD-10-CM

## 2013-01-13 MED ORDER — TETANUS-DIPHTH-ACELL PERTUSSIS 5-2.5-18.5 LF-MCG/0.5 IM SUSP
0.5000 mL | Freq: Once | INTRAMUSCULAR | Status: AC
  Administered 2013-01-13: 0.5 mL via INTRAMUSCULAR

## 2013-01-13 NOTE — Progress Notes (Signed)
Well, no c/o except lots of pressure. GBS and GC today, pt requests TDAP. Rev'd precautions.

## 2013-01-13 NOTE — Progress Notes (Signed)
p=91 

## 2013-01-13 NOTE — Patient Instructions (Signed)
Pregnancy - Third Trimester  The third trimester begins at the 28th week of pregnancy and ends at birth. It is important to follow your doctor's instructions.  HOME CARE    Go to your doctor's visits.   Do not smoke.   Do not drink alcohol or use drugs.   Only take medicine as told by your doctor.   Take prenatal vitamins as told. The vitamin should contain 1 milligram of folic acid.   Exercise.   Eat healthy foods. Eat regular, well-balanced meals.   You can have sex (intercourse) if there are no other problems with the pregnancy.   Do not use hot tubs, steam rooms, or saunas.   Wear a seat belt while driving.   Avoid raw meat, uncooked cheese, and litter boxes and soil used by cats.   Rest with your legs raised (elevated).   Make a list of emergency phone numbers. Keep this list with you.   Arrange for help when you come back home after delivering the baby.   Make a trial run to the hospital.   Take prenatal classes.   Prepare the baby's nursery.   Do not travel out of the city. If you absolutely have to, get permission from your doctor first.   Wear flat shoes. Do not wear high heels.  GET HELP RIGHT AWAY IF:    You have a temperature by mouth above 102 F (38.9 C), not controlled by medicine.   You have not felt the baby move for more than 1 hour. If you think the baby is not moving as much as normal, eat something with sugar in it or lie down on your left side for an hour. The baby should move at least 4 to 5 times per hour.   Fluid is coming from the vagina.   Blood is coming from the vagina. Light spotting is common, especially after sex (intercourse).   You have belly (abdominal) pain.   You have a bad smelling fluid (discharge) coming from the vagina. The fluid changes from clear to white.   You still feel sick to your stomach (nauseous).   You throw up (vomit) for more than 24 hours.   You have the chills.   You have shortness of breath.   You have a burning feeling when you  pee (urinate).   You lose or gain more than 2 pounds (0.9 kilograms) of weight over a week, or as told by your doctor.   Your face, hands, feet, or legs get puffy (swell).   You have a bad headache that will not go away.   You start to have problems seeing (blurry or double vision).   You fall, are in a car accident, or have any kind of trauma.   There is mental or physical violence at home.   You have any concerns or worries during your pregnancy.  MAKE SURE YOU:    Understand these instructions.   Will watch your condition.   Will get help right away if you are not doing well or get worse.  Document Released: 10/07/2009 Document Revised: 10/05/2011 Document Reviewed: 10/07/2009  [employer] Patient Information 2014 [employer].

## 2013-01-20 ENCOUNTER — Ambulatory Visit (INDEPENDENT_AMBULATORY_CARE_PROVIDER_SITE_OTHER): Payer: Medicaid Other | Admitting: Family

## 2013-01-20 VITALS — BP 105/71 | Wt 185.0 lb

## 2013-01-20 DIAGNOSIS — Z3483 Encounter for supervision of other normal pregnancy, third trimester: Secondary | ICD-10-CM

## 2013-01-20 DIAGNOSIS — Z3480 Encounter for supervision of other normal pregnancy, unspecified trimester: Secondary | ICD-10-CM

## 2013-01-20 NOTE — Progress Notes (Signed)
Continues to feel pressure; no bleeding or leaking of fluid; reviewed labor precautions.

## 2013-01-30 ENCOUNTER — Ambulatory Visit (INDEPENDENT_AMBULATORY_CARE_PROVIDER_SITE_OTHER): Payer: Medicaid Other | Admitting: Advanced Practice Midwife

## 2013-01-30 VITALS — BP 125/81 | Wt 186.0 lb

## 2013-01-30 DIAGNOSIS — Z3480 Encounter for supervision of other normal pregnancy, unspecified trimester: Secondary | ICD-10-CM

## 2013-01-30 DIAGNOSIS — Z3483 Encounter for supervision of other normal pregnancy, third trimester: Secondary | ICD-10-CM

## 2013-01-30 NOTE — Progress Notes (Signed)
Well, no c/o. Rev'd labor precautions. Membranes swept.

## 2013-01-30 NOTE — Progress Notes (Signed)
P - 97 

## 2013-01-30 NOTE — Patient Instructions (Signed)
Pregnancy - Third Trimester  The third trimester begins at the 28th week of pregnancy and ends at birth. It is important to follow your doctor's instructions.  HOME CARE    Go to your doctor's visits.   Do not smoke.   Do not drink alcohol or use drugs.   Only take medicine as told by your doctor.   Take prenatal vitamins as told. The vitamin should contain 1 milligram of folic acid.   Exercise.   Eat healthy foods. Eat regular, well-balanced meals.   You can have sex (intercourse) if there are no other problems with the pregnancy.   Do not use hot tubs, steam rooms, or saunas.   Wear a seat belt while driving.   Avoid raw meat, uncooked cheese, and litter boxes and soil used by cats.   Rest with your legs raised (elevated).   Make a list of emergency phone numbers. Keep this list with you.   Arrange for help when you come back home after delivering the baby.   Make a trial run to the hospital.   Take prenatal classes.   Prepare the baby's nursery.   Do not travel out of the city. If you absolutely have to, get permission from your doctor first.   Wear flat shoes. Do not wear high heels.  GET HELP RIGHT AWAY IF:    You have a temperature by mouth above 102 F (38.9 C), not controlled by medicine.   You have not felt the baby move for more than 1 hour. If you think the baby is not moving as much as normal, eat something with sugar in it or lie down on your left side for an hour. The baby should move at least 4 to 5 times per hour.   Fluid is coming from the vagina.   Blood is coming from the vagina. Light spotting is common, especially after sex (intercourse).   You have belly (abdominal) pain.   You have a bad smelling fluid (discharge) coming from the vagina. The fluid changes from clear to white.   You still feel sick to your stomach (nauseous).   You throw up (vomit) for more than 24 hours.   You have the chills.   You have shortness of breath.   You have a burning feeling when you  pee (urinate).   You lose or gain more than 2 pounds (0.9 kilograms) of weight over a week, or as told by your doctor.   Your face, hands, feet, or legs get puffy (swell).   You have a bad headache that will not go away.   You start to have problems seeing (blurry or double vision).   You fall, are in a car accident, or have any kind of trauma.   There is mental or physical violence at home.   You have any concerns or worries during your pregnancy.  MAKE SURE YOU:    Understand these instructions.   Will watch your condition.   Will get help right away if you are not doing well or get worse.  Document Released: 10/07/2009 Document Revised: 10/05/2011 Document Reviewed: 10/07/2009  [employer] Patient Information 2014 [employer].

## 2013-02-01 ENCOUNTER — Telehealth: Payer: Self-pay | Admitting: *Deleted

## 2013-02-01 ENCOUNTER — Encounter: Payer: Self-pay | Admitting: *Deleted

## 2013-02-01 NOTE — Telephone Encounter (Signed)
Pt called wanted to go ahead and start her FMLA.  Her EDD is 02/10/13.

## 2013-02-06 ENCOUNTER — Ambulatory Visit (INDEPENDENT_AMBULATORY_CARE_PROVIDER_SITE_OTHER): Payer: Medicaid Other | Admitting: Advanced Practice Midwife

## 2013-02-06 VITALS — BP 111/75

## 2013-02-06 DIAGNOSIS — O4413 Placenta previa with hemorrhage, third trimester: Secondary | ICD-10-CM

## 2013-02-06 DIAGNOSIS — Z3480 Encounter for supervision of other normal pregnancy, unspecified trimester: Secondary | ICD-10-CM

## 2013-02-06 DIAGNOSIS — O4410 Placenta previa with hemorrhage, unspecified trimester: Secondary | ICD-10-CM

## 2013-02-06 NOTE — Progress Notes (Signed)
P - 91 - Pt has lost mucous plug

## 2013-02-06 NOTE — Progress Notes (Signed)
Doing well.  Good fetal movement, denies vaginal bleeding, LOF, regular contractions.  Membranes swept at pt request, no bleeding noted during exam.

## 2013-02-06 NOTE — Progress Notes (Signed)
p 

## 2013-02-13 ENCOUNTER — Encounter: Payer: Medicaid Other | Admitting: Advanced Practice Midwife

## 2013-02-13 ENCOUNTER — Ambulatory Visit (INDEPENDENT_AMBULATORY_CARE_PROVIDER_SITE_OTHER): Payer: Medicaid Other | Admitting: Advanced Practice Midwife

## 2013-02-13 VITALS — BP 130/86 | Wt 187.0 lb

## 2013-02-13 DIAGNOSIS — Z3480 Encounter for supervision of other normal pregnancy, unspecified trimester: Secondary | ICD-10-CM

## 2013-02-13 DIAGNOSIS — Z3483 Encounter for supervision of other normal pregnancy, third trimester: Secondary | ICD-10-CM

## 2013-02-13 NOTE — Progress Notes (Signed)
Discussed labor. Wants noninterventive birth. OK for no IV with caveat for emergencies, may need IM pitocin PRN. Pt agrees. Went fast on first baby, induced on second. Membranes swept per request today. Bled after exam.

## 2013-02-13 NOTE — Patient Instructions (Signed)
Vaginal Delivery Your caregiver must first be sure you are in labor. Signs of labor include:  You may pass what is called "the mucus plug" before labor begins. This is a small amount of blood stained mucus.  Regular uterine contractions.  The time between contractions get closer together.  The discomfort and pain gradually gets more intense.  Pains are mostly located in the back.  Pains get worse when walking.  The cervix (the opening of the uterus) becomes thinner (begins to efface) and opens up (dilates). Once you are in labor and admitted into the hospital or care center, your caregiver will do the following:  A complete physical examination.  Check your vital signs (blood pressure, pulse, temperature and the fetal heart rate).  Do a vaginal examination (using a sterile glove and lubricant) to determine:  The position (presentation) of the baby (head [vertex] or buttock first).  The level (station) of the baby's head in the birth canal.  The effacement and dilatation of the cervix.  An electronic monitor is usually placed on your abdomen. The monitor follows the length and intensity of the contractions, as well as the baby's heart rate.  Usually, your caregiver will insert an IV in your arm with a bottle of sugar water. This is done as a precaution so that medications can be given to you quickly during labor or delivery. NORMAL LABOR AND DELIVERY IS DIVIDED UP INTO 3 STAGES: First Stage This is when regular contractions begin and the cervix begins to efface and dilate. This stage can last from 3 to 15 hours. The end of the first stage is when the cervix is 100% effaced and 10 centimeters dilated. Pain medications may be given by   Injection (morphine, demerol, etc.)  Regional anesthesia (spinal, caudal or epidural, anesthetics given in different locations of the spine). Paracervical pain medication may be given, which is an injection of and anesthetic on each side of the  cervix. A pregnant woman may request to have "Natural Childbirth" which is not to have any medications or anesthesia during her labor and delivery. Second Stage This is when the baby comes down through the birth canal (vagina) and is born. This can take 1 to 4 hours. As the baby's head comes down through the birth canal, you may feel like you are going to have a bowel movement. You will get the urge to bear down and push until the baby is delivered. As the baby's head is being delivered, the caregiver will decide if an episiotomy (a cut in the perineum and vagina area) is needed to prevent tearing of the tissue in this area. The episiotomy is sewn up after the delivery of the baby and placenta. Sometimes a mask with nitrous oxide is given for the mother to breath during the delivery of the baby to help if there is too much pain. The end of Stage 2 is when the baby is fully delivered. Then when the umbilical cord stops pulsating it is clamped and cut. Third Stage The third stage begins after the baby is completely delivered and ends after the placenta (afterbirth) is delivered. This usually takes 5 to 30 minutes. After the placenta is delivered, a medication is given either by intravenous or injection to help contract the uterus and prevent bleeding. The third stage is not painful and pain medication is usually not necessary. If an episiotomy was done, it is repaired at this time. After the delivery, the mother is watched and monitored closely for   1 to 2 hours to make sure there is no postpartum bleeding (hemorrhage). If there is a lot of bleeding, medication is given to contract the uterus and stop the bleeding. Document Released: 04/21/2008 Document Revised: 04/06/2012 Document Reviewed: 04/21/2008 [employer] Patient Information 2014 [employer].  

## 2013-02-13 NOTE — Progress Notes (Signed)
p-88 

## 2013-02-18 ENCOUNTER — Inpatient Hospital Stay (HOSPITAL_COMMUNITY)
Admission: AD | Admit: 2013-02-18 | Discharge: 2013-02-19 | DRG: 775 | Disposition: A | Discharge status: Home / Self Care | Payer: Medicaid Other | Source: Ambulatory Visit | Attending: Obstetrics & Gynecology | Admitting: Obstetrics & Gynecology

## 2013-02-18 ENCOUNTER — Encounter (HOSPITAL_COMMUNITY): Payer: Self-pay | Admitting: *Deleted

## 2013-02-18 LAB — CBC WITH DIFFERENTIAL/PLATELET
Basophils Absolute: 0 10*3/uL (ref 0.0–0.1)
Eosinophils Absolute: 0 10*3/uL (ref 0.0–0.7)
HCT: 27.8 % — ABNORMAL LOW (ref 36.0–46.0)
Lymphocytes Relative: 6 % — ABNORMAL LOW (ref 12–46)
MCHC: 35.3 g/dL (ref 30.0–36.0)
Neutro Abs: 25.7 10*3/uL — ABNORMAL HIGH (ref 1.7–7.7)
Platelets: 199 10*3/uL (ref 150–400)
RDW: 14.1 % (ref 11.5–15.5)
WBC Morphology: INCREASED

## 2013-02-18 LAB — TYPE AND SCREEN: Antibody Screen: NEGATIVE

## 2013-02-18 MED ORDER — BENZOCAINE-MENTHOL 20-0.5 % EX AERO
1.0000 "application " | INHALATION_SPRAY | CUTANEOUS | Status: DC | PRN
  Administered 2013-02-18: 1 via TOPICAL
  Filled 2013-02-18: qty 56

## 2013-02-18 MED ORDER — ZOLPIDEM TARTRATE 5 MG PO TABS: 5.0000 mg | ORAL_TABLET | Freq: Every evening | ORAL | Status: DC | PRN

## 2013-02-18 MED ORDER — TETANUS-DIPHTH-ACELL PERTUSSIS 5-2.5-18.5 LF-MCG/0.5 IM SUSP: 0.5000 mL | Freq: Once | INTRAMUSCULAR | Status: DC

## 2013-02-18 MED ORDER — OXYTOCIN 10 UNIT/ML IJ SOLN
INTRAMUSCULAR | Status: AC
  Administered 2013-02-18: 20 [IU] via INTRAMUSCULAR
  Filled 2013-02-18: qty 2

## 2013-02-18 MED ORDER — MEASLES, MUMPS & RUBELLA VAC ~~LOC~~ INJ
0.5000 mL | INJECTION | Freq: Once | SUBCUTANEOUS | Status: DC
  Filled 2013-02-18: qty 0.5

## 2013-02-18 MED ORDER — PRENATAL MULTIVITAMIN CH
1.0000 | ORAL_TABLET | Freq: Every day | ORAL | Status: DC
  Administered 2013-02-19: 1 via ORAL
  Filled 2013-02-18: qty 1

## 2013-02-18 MED ORDER — SIMETHICONE 80 MG PO CHEW: 80.0000 mg | CHEWABLE_TABLET | ORAL | Status: DC | PRN

## 2013-02-18 MED ORDER — LANOLIN HYDROUS EX OINT: TOPICAL_OINTMENT | CUTANEOUS | Status: DC | PRN

## 2013-02-18 MED ORDER — DIBUCAINE 1 % RE OINT: 1.0000 "application " | TOPICAL_OINTMENT | RECTAL | Status: DC | PRN

## 2013-02-18 MED ORDER — IBUPROFEN 600 MG PO TABS
600.0000 mg | ORAL_TABLET | Freq: Four times a day (QID) | ORAL | Status: DC
  Administered 2013-02-18 – 2013-02-19 (×5): 600 mg via ORAL
  Filled 2013-02-18 (×5): qty 1

## 2013-02-18 MED ORDER — ONDANSETRON HCL 4 MG PO TABS: 4.0000 mg | ORAL_TABLET | ORAL | Status: DC | PRN

## 2013-02-18 MED ORDER — ONDANSETRON HCL 4 MG/2ML IJ SOLN: 4.0000 mg | INTRAMUSCULAR | Status: DC | PRN

## 2013-02-18 MED ORDER — SENNOSIDES-DOCUSATE SODIUM 8.6-50 MG PO TABS
2.0000 | ORAL_TABLET | Freq: Every day | ORAL | Status: DC
  Administered 2013-02-18 – 2013-02-19 (×2): 2 via ORAL

## 2013-02-18 MED ORDER — OXYCODONE-ACETAMINOPHEN 5-325 MG PO TABS
1.0000 | ORAL_TABLET | ORAL | Status: DC | PRN
  Administered 2013-02-18: 1 via ORAL
  Administered 2013-02-19: 2 via ORAL
  Filled 2013-02-18: qty 2
  Filled 2013-02-18: qty 1

## 2013-02-18 MED ORDER — WITCH HAZEL-GLYCERIN EX PADS: 1.0000 "application " | MEDICATED_PAD | CUTANEOUS | Status: DC | PRN

## 2013-02-18 MED ORDER — ALBUTEROL SULFATE HFA 108 (90 BASE) MCG/ACT IN AERS: 1.0000 | INHALATION_SPRAY | Freq: Four times a day (QID) | RESPIRATORY_TRACT | Status: DC | PRN

## 2013-02-18 MED ORDER — DIPHENHYDRAMINE HCL 25 MG PO CAPS: 25.0000 mg | ORAL_CAPSULE | Freq: Four times a day (QID) | ORAL | Status: DC | PRN

## 2013-02-18 NOTE — H&P (Signed)
Katherine Weber is a 27 y.o. female G3P2002 with IUP at [redacted]w[redacted]d presenting for regular contractions. Pt states she has been having regular, every 2-3 minutes contractions, associated with none vaginal bleeding.  Membranes ruptured as she was being transferred into the bed in triage, with active fetal movement.    [facility] at [facility] and then transferred to [facility] since 10 wks  Prenatal History/Complications:  Past Medical History: Past Medical History  Diagnosis Date  . Post partum depression   . Thrombocytopenia complicating pregnancy 2011    Past Surgical History: Past Surgical History  Procedure Laterality Date  . Hand surgery      Obstetrical History: OB History   Grav Para Term Preterm Abortions TAB SAB Ect Mult Living   3 2 2       2       Gynecological History: OB History   Grav Para Term Preterm Abortions TAB SAB Ect Mult Living   3 2 2       2       Social History: History   Social History  . Marital Status: Single    Spouse Name: N/A    Number of Children: N/A  . Years of Education: N/A   Social History Main Topics  . Smoking status: Current Every Day Smoker -- 0.50 packs/day for 15 years    Types: Cigarettes  . Smokeless tobacco: Never Used  . Alcohol Use: No  . Drug Use: No  . Sexually Active: Yes -- Female partner(s)    Birth Control/ Protection: IUD   Other Topics Concern  . None   Social History Narrative  . None    Family History: Family History  Problem Relation Age of Onset  . Cancer Maternal Grandmother     Bone and lung  . Diabetes Father   . Hypertension Father   . Heart disease Father   . SIDS Brother     Allergies: No Known Allergies  Prescriptions prior to admission  Medication Sig Dispense Refill  . albuterol (PROVENTIL HFA;VENTOLIN HFA) 108 (90 BASE) MCG/ACT inhaler Inhale 1-2 puffs into the lungs every 6 (six) hours as needed for wheezing.  1 Inhaler  0  . cetirizine (ZYRTEC) 10 MG tablet Take 1 tablet (10 mg total)  by mouth daily.  30 tablet  2  . cyclobenzaprine (FLEXERIL) 10 MG tablet Take 1 tablet (10 mg total) by mouth every 8 (eight) hours as needed for muscle spasms.  30 tablet  1  . nystatin-triamcinolone (MYCOLOG II) cream Apply topically 2 (two) times daily. Do not use longer than 2 weeks  15 g  0  . prenatal vitamin w/FE, FA (PRENATAL 1 + 1) 27-1 MG TABS Take 1 tablet by mouth daily.         Review of Systems   Constitutional: Negative for fever, chills, weight loss, malaise/fatigue and diaphoresis.  HENT: Negative for hearing loss, ear pain, nosebleeds, congestion, sore throat, neck pain, tinnitus and ear discharge.   Eyes: Negative for blurred vision, double vision, photophobia, pain, discharge and redness.  Respiratory: Negative for cough, hemoptysis, sputum production, shortness of breath, wheezing and stridor.   Cardiovascular: Negative for chest pain, palpitations, orthopnea,  leg swelling  Gastrointestinal: Positive for abdominal pain. Negative for heartburn, nausea, vomiting, diarrhea, constipation, blood in stool Genitourinary: Negative for dysuria, urgency, frequency, hematuria and flank pain.  Musculoskeletal: Negative for myalgias, back pain, joint pain and falls.  Skin: Negative for itching and rash.  Neurological: Negative for dizziness, tingling, tremors,  sensory change, speech change, focal weakness, seizures, loss of consciousness, weakness and headaches.  Endo/Heme/Allergies: Negative for environmental allergies and polydipsia. Does not bruise/bleed easily.  Psychiatric/Behavioral: Negative for depression, suicidal ideas, hallucinations, memory loss and substance abuse. The patient is not nervous/anxious and does not have insomnia.       Blood pressure 131/78, pulse 101, temperature 97.8 F (36.6 C), temperature source Oral, resp. rate 18, height 5\' 8"  (1.727 m), weight 84.823 kg (187 lb), last menstrual period 05/06/2012. General appearance: alert, cooperative and mild  distress Lungs: normal respiratory rate Heart: regular rate and rhythm Abdomen: soft, non-tender; bowel sounds normal Extremities: Homans sign is negative, no sign of DVT DTR's 2+ Presentation: cephalic Fetal monitoringBaseline: 145 bpm, Variability: Good {> 6 bpm), Accelerations: Reactive and Decelerations: Variable: mild Uterine activity q2-3 min  Dilation: 9 Effacement (%): 100 Station: 0   Prenatal labs: ABO, Rh: O/Positive/-- (12/31 0000) Antibody: Negative (12/31 0000) Rubella:   RPR: NON REAC (04/25 1054)  HBsAg: Negative (12/31 0000)  HIV: NON REACTIVE (04/25 1054)  GBS:   negative 1 hr Glucola 111 Genetic screening  declined Anatomy US normal- Ant placenta 2.4cm from cervix    Assessment: Katherine Weber is a 27 y.o. [id number] with an IUP at [redacted]w[redacted]d presenting for active labor.   Plan: 1) Active labor - pt quickly progressed to complete and delivered a liveborn female in the MAU without complications. See delivery for complete details.  - GBS negative  2) admit to postpartum - routine post partum orders   - breast feeding   3) FWB- liveborn female with spontaneous cry.   Vale Haven, MD 02/18/2013, 4:46 PM

## 2013-02-18 NOTE — MAU Note (Signed)
Pt in c/o ucs since 0300.  Reports bloody, mucus discharge.  Denies any leaking of fluid.  + FM.

## 2013-02-19 MED ORDER — IBUPROFEN 600 MG PO TABS: 600.0000 mg | ORAL_TABLET | Freq: Four times a day (QID) | ORAL | Status: DC

## 2013-02-19 MED ORDER — SENNOSIDES-DOCUSATE SODIUM 8.6-50 MG PO TABS: 2.0000 | ORAL_TABLET | Freq: Every day | ORAL | Status: DC

## 2013-02-19 NOTE — Progress Notes (Signed)
Post Partum Day 1 Subjective: Eating, drinking, voiding, ambulating well.  +flatus.  Lochia and pain wnl.  Denies dizziness, lightheadedness, or sob. No complaints.  May desire early d/c later this afternoon when infant 24hrs old, will let nurse know if she decides to go  Objective: Blood pressure 103/60, pulse 91, temperature 97.9 F (36.6 C), temperature source Oral, resp. rate 20, height 5\' 8"  (1.727 m), weight 84.823 kg (187 lb), last menstrual period 05/06/2012, SpO2 98.00%, unknown if currently breastfeeding.  Physical Exam:  General: alert, cooperative and no distress Lochia: appropriate Uterine Fundus: firm Incision: n/a DVT Evaluation: No evidence of DVT seen on physical exam. Negative Homan's sign. No cords or calf tenderness. No significant calf/ankle edema.   Recent Labs  02/18/13 1824  HGB 9.8*  HCT 27.8*    Assessment/Plan: Breastfeeding, Lactation consult and Contraception interim BTL, possible d/c later this afternoon when infant 24hrs old Does not desire circumcision   LOS: 1 day   Marge Duncans 02/19/2013, 7:08 AM

## 2013-02-19 NOTE — H&P (Signed)
Attestation of Attending Supervision of Obstetric Fellow: Evaluation and management procedures were performed by the Obstetric Fellow under my supervision and collaboration.  I have reviewed the Obstetric Fellow's note and chart, and I agree with the management and plan.  Joban Colledge, MD, FACOG Attending Obstetrician & Gynecologist Faculty Practice, [facility]   

## 2013-02-19 NOTE — Discharge Summary (Signed)
Obstetric Discharge Summary Reason for Admission: onset of labor Prenatal Procedures: none Intrapartum Procedures: spontaneous vaginal delivery Postpartum Procedures: none Complications-Operative and Postpartum: none  Patient is breastfeeding and plans interval BTL for contraception.  Hemoglobin  Date Value Range Status  02/18/2013 9.8* 12.0 - 15.0 g/dL Final  16/04/9603 54.0   Final     HCT  Date Value Range Status  02/18/2013 27.8* 36.0 - 46.0 % Final  07/26/2012 37   Final    Physical Exam:  See progress note dated 02/19/13  Discharge Diagnoses: Term Pregnancy-delivered  Discharge Information: Date: 02/19/2013 Activity: pelvic rest Diet: routine Medications: PNV, Ibuprofen, Colace and Iron Condition: stable Instructions: refer to practice specific booklet Discharge to: home Follow-up Information   Follow up with [facility] [place]. Schedule an appointment as soon as possible for a visit in 4 weeks. (Call ASAP to make appointment to discuss/schedule tubal ligation and for postpartum visit)    Contact information:   [address] [place] [address] [phone]      Newborn Data: Live born female  Birth Weight: 6 lb 14.4 oz (3130 g) APGAR: 9, 9  Home with mother.  Napoleon Form 02/19/2013, 6:30 PM

## 2013-02-19 NOTE — Discharge Summary (Signed)
Attestation of Attending Supervision of Advanced Practitioner: Evaluation and management procedures were performed by the PA/NP/CNM/OB Fellow under my supervision/collaboration. Chart reviewed and agree with management and plan.  Sally Menard V 02/19/2013 9:21 PM

## 2013-02-20 NOTE — Progress Notes (Signed)
Post discharge chart review completed.  

## 2013-03-16 ENCOUNTER — Encounter: Payer: Self-pay | Admitting: Obstetrics & Gynecology

## 2013-03-16 ENCOUNTER — Ambulatory Visit (INDEPENDENT_AMBULATORY_CARE_PROVIDER_SITE_OTHER): Payer: Medicaid Other | Admitting: Obstetrics & Gynecology

## 2013-03-16 NOTE — Progress Notes (Signed)
  Subjective:     Katherine Weber is a 27 y.o. female who presents for a postpartum visit. She is 4 weeks postpartum following a spontaneous vaginal delivery. I have fully reviewed the prenatal and intrapartum course. The delivery was at 41 gestational weeks. Outcome: spontaneous vaginal delivery. Postpartum course has been uneventful. Baby's course has been unremarkable. Baby is feeding by breast. Bleeding no bleeding. Bowel function is normal. Bladder function is normal. Patient is not sexually active. Contraception method is IUD. Postpartum depression screening: negative.  The following portions of the patient's history were reviewed and updated as appropriate: allergies, current medications, past family history, past medical history, past social history, past surgical history and problem list.  Review of Systems A comprehensive review of systems was negative.   Objective:    BP 111/74  Pulse 89  Resp 16  Wt 173 lb (78.472 kg)  BMI 26.31 kg/m2  Breastfeeding? Yes  General:  alert, cooperative and no distress   Breasts:  inspection negative, no nipple discharge or bleeding, no masses or nodularity palpable  Lungs: clear to auscultation bilaterally  Heart:  impulse: n/a and regular rate and rhythm  Abdomen: soft, non-tender; bowel sounds normal; no masses,  no organomegaly   Vulva:  normal  Vagina: normal vagina  Cervix:  no cervical motion tenderness  Corpus: normal size, contour, position, consistency, mobility, non-tender  Adnexa:  no mass, fullness, tenderness  Rectal Exam: Not performed.        Assessment:     4 weeks postpartum exam. Pap smear not done at today's visit.  Urticaria of lower abdomen--OTC hydrocortisone  Plan:    1. Contraception: IUD 2. IUD insert in 2 weeks 3. Follow up in: 2 weeks or as needed.

## 2013-03-31 ENCOUNTER — Ambulatory Visit (INDEPENDENT_AMBULATORY_CARE_PROVIDER_SITE_OTHER): Payer: Medicaid Other | Admitting: Family

## 2013-03-31 VITALS — BP 112/70 | HR 77 | Resp 16 | Ht 65.0 in | Wt 175.0 lb

## 2013-03-31 DIAGNOSIS — Z30430 Encounter for insertion of intrauterine contraceptive device: Secondary | ICD-10-CM

## 2013-03-31 DIAGNOSIS — Z01812 Encounter for preprocedural laboratory examination: Secondary | ICD-10-CM

## 2013-03-31 MED ORDER — LEVONORGESTREL 20 MCG/24HR IU IUD
INTRAUTERINE_SYSTEM | Freq: Once | INTRAUTERINE | Status: AC
  Administered 2013-03-31: 10:00:00 via INTRAUTERINE

## 2013-03-31 NOTE — Progress Notes (Signed)
  Subjective:    Patient ID: Katherine Weber, female    DOB: October 14, 1985, 27 y.o.   MRN: [id number]  HPI  Pt is here six weeks postpartum for IUD placement.  Not bleeding at this time.  Not sexually active.  +breastfeeding.  Review of Systems Pertinent info in HPI    Objective:   Physical Exam  Constitutional: She is oriented to person, place, and time. She appears well-developed and well-nourished.  HENT:  Head: Normocephalic and atraumatic.  Neck: Normal range of motion. Neck supple. No thyromegaly present.  Cardiovascular: Normal rate, regular rhythm and normal heart sounds.   Pulmonary/Chest: Effort normal and breath sounds normal. No respiratory distress.  Abdominal: Soft. Bowel sounds are normal. There is no tenderness.  Uterus involuted  Neurological: She is alert and oriented to person, place, and time.  Skin: Skin is warm and dry.   Filed Vitals:   03/31/13 0919  BP: 112/70  Pulse: 77  Resp: 16    HCG - negative  Procedures: Pt consented for IUD  insertion.  Pt placed in lithotomy position.  Speculum inserted and cervix cleaned with betadine solution. Uterus sounded to 6 cm; IUD inserted without difficulty.  Strings cut at approximately 3 cm.  Speculum removed.     Assessment & Plan:  Mirena Insertion  Plan: Explained possible side effects and what to report. Return in 4-6 weeks for IUD string check.  [facility]

## 2013-03-31 NOTE — Addendum Note (Signed)
Addended by: Granville Lewis on: 03/31/2013 10:02 AM   Modules accepted: Orders

## 2013-04-10 ENCOUNTER — Encounter: Payer: Self-pay | Admitting: *Deleted

## 2013-05-03 ENCOUNTER — Ambulatory Visit: Payer: Medicaid Other | Admitting: Obstetrics & Gynecology

## 2013-05-08 ENCOUNTER — Ambulatory Visit (INDEPENDENT_AMBULATORY_CARE_PROVIDER_SITE_OTHER): Payer: Medicaid Other | Admitting: Advanced Practice Midwife

## 2013-05-08 ENCOUNTER — Encounter: Payer: Self-pay | Admitting: Advanced Practice Midwife

## 2013-05-08 VITALS — BP 119/75 | HR 76 | Resp 16 | Ht 67.0 in | Wt 179.0 lb

## 2013-05-08 DIAGNOSIS — Z30431 Encounter for routine checking of intrauterine contraceptive device: Secondary | ICD-10-CM

## 2013-05-08 DIAGNOSIS — Z302 Encounter for sterilization: Secondary | ICD-10-CM | POA: Insufficient documentation

## 2013-05-08 DIAGNOSIS — Z30430 Encounter for insertion of intrauterine contraceptive device: Secondary | ICD-10-CM

## 2013-05-08 NOTE — Progress Notes (Signed)
  Subjective:    Patient ID: Katherine Weber, female    DOB: 02-20-1986, 27 y.o.   MRN: [id number]  HPI This is a 27 y.o. female who is here for a string check of her IUD.   Delivery July 2014 IUD placed on 03/31/13  States string is too long  Review of Systems  Constitutional: Negative for fever.  Genitourinary: Negative for difficulty urinating, menstrual problem and pelvic pain.       Objective:   Physical Exam  Constitutional: She is oriented to person, place, and time. She appears well-developed and well-nourished. No distress.  HENT:  Head: Normocephalic.  Cardiovascular: Normal rate.   Pulmonary/Chest: Effort normal.  Abdominal: Soft. There is no tenderness.  Genitourinary: Vagina normal and uterus normal. No vaginal discharge found.  String visible, long Cut to 2cm Cervix closed  Musculoskeletal: Normal range of motion.  Neurological: She is alert and oriented to person, place, and time.  Skin: Skin is warm and dry.  Psychiatric: She has a normal mood and affect.          Assessment & Plan:  A:  IUD string in place  P:  String clipped       Return as needed

## 2013-05-08 NOTE — Patient Instructions (Signed)

## 2013-09-27 ENCOUNTER — Encounter (HOSPITAL_BASED_OUTPATIENT_CLINIC_OR_DEPARTMENT_OTHER): Payer: Self-pay | Admitting: Emergency Medicine

## 2013-09-27 ENCOUNTER — Emergency Department (HOSPITAL_BASED_OUTPATIENT_CLINIC_OR_DEPARTMENT_OTHER)
Admission: EM | Admit: 2013-09-27 | Discharge: 2013-09-27 | Disposition: A | Discharge status: Home / Self Care | Payer: Medicaid Other | Attending: Emergency Medicine | Admitting: Emergency Medicine

## 2013-09-27 DIAGNOSIS — O9989 Other specified diseases and conditions complicating pregnancy, childbirth and the puerperium: Secondary | ICD-10-CM | POA: Insufficient documentation

## 2013-09-27 DIAGNOSIS — R221 Localized swelling, mass and lump, neck: Secondary | ICD-10-CM

## 2013-09-27 DIAGNOSIS — R220 Localized swelling, mass and lump, head: Secondary | ICD-10-CM | POA: Insufficient documentation

## 2013-09-27 DIAGNOSIS — B3789 Other sites of candidiasis: Secondary | ICD-10-CM

## 2013-09-27 DIAGNOSIS — O9102 Infection of nipple associated with the puerperium: Secondary | ICD-10-CM | POA: Insufficient documentation

## 2013-09-27 DIAGNOSIS — O99335 Smoking (tobacco) complicating the puerperium: Secondary | ICD-10-CM | POA: Insufficient documentation

## 2013-09-27 MED ORDER — CEPHALEXIN 500 MG PO CAPS: 500.0000 mg | ORAL_CAPSULE | Freq: Four times a day (QID) | ORAL | Status: AC

## 2013-09-27 MED ORDER — FLUCONAZOLE 50 MG PO TABS
150.0000 mg | ORAL_TABLET | Freq: Once | ORAL | Status: AC
  Administered 2013-09-27: 150 mg via ORAL
  Filled 2013-09-27 (×2): qty 1

## 2013-09-27 NOTE — Discharge Instructions (Signed)
Yeast Infection of the Skin Some yeast on the skin is normal, but sometimes it causes an infection. If you have a yeast infection, it shows up as white or light brown patches on brown skin. You can see it better in the summer on tan skin. It causes light-colored holes in your suntan. It can happen on any area of the body. This cannot be passed from person to person. HOME CARE  Scrub your skin daily with a dandruff shampoo. Your rash may take a couple weeks to get well.  Do not scratch or itch the rash. GET HELP RIGHT AWAY IF:   You get another infection from scratching. The skin may get warm, red, and may ooze fluid.  The infection does not seem to be getting better. MAKE SURE YOU:  Understand these instructions.  Will watch your condition.  Will get help right away if you are not doing well or get worse. Document Released: 06/25/2008 Document Revised: 10/05/2011 Document Reviewed: 06/25/2008 [facility] Patient Information 2014 [employer] [place].  Mastitis  Mastitis is inflammation of the breast tissue. It occurs most often in women who are breastfeeding, but it can also affect other women, and even sometimes men. CAUSES  Mastitis is usually caused by a bacterial infection. Bacteria enter the breast tissue through cuts or openings in the skin. Typically, this occurs with breastfeeding because of cracked or irritated skin. Sometimes, it can occur even when there is no opening in the skin. It can be associated with plugged milk (lactiferous) ducts. Nipple piercing can also lead to mastitis. Also, some forms of breast cancer can cause mastitis. SIGNS AND SYMPTOMS   Swelling, redness, tenderness, and pain in an area of the breast.  Swelling of the glands under the arm on the same side.  Fever. If an infection is allowed to progress, a collection of pus (abscess) may develop. DIAGNOSIS  Your health care provider can usually diagnose mastitis based on your symptoms and a physical exam. Tests  may be done to help confirm the diagnosis. These may include:   Removal of pus from the breast by applying pressure to the area. This pus can be examined in the lab to determine which bacteria are present. If an abscess has developed, the fluid in the abscess can be removed with a needle. This can also be used to confirm the diagnosis and determine the bacteria present. In most cases, pus will not be present.  Blood tests to determine if your body is fighting a bacterial infection.  Mammogram or ultrasound tests to rule out other problems or diseases. TREATMENT  Antibiotic medicine is used to treat a bacterial infection. Your health care provider will determine which bacteria are most likely causing the infection and will select an appropriate antibiotic. This is sometimes changed based on the results of tests performed to identify the bacteria, or if there is no response to the antibiotic selected. Antibiotics are usually given by mouth. You may also be given medicine for pain. Mastitis that occurs with breastfeeding will sometimes go away on its own, so your health care provider may choose to wait 24 hours after first seeing you to decide whether a prescription medicine is needed. HOME CARE INSTRUCTIONS   Only take over-the-counter or prescription medicines for pain, fever, or discomfort as directed by your health care provider.  If your health care provider prescribed an antibiotic, take the medicine as directed. Make sure you finish it even if you start to feel better.  Do not wear  a tight or underwire bra. Wear a soft, supportive bra.  Increase your fluid intake, especially if you have a fever.  Women who are breastfeeding should follow these instructions:  Continue to empty the breast. Your health care provider can tell you whether this milk is safe for your infant or needs to be thrown out. You may be told to stop nursing until your health care provider thinks it is safe for your baby.  Use a breast pump if you are advised to stop nursing.  Keep your nipples clean and dry.  Empty the first breast completely before going to the other breast. If your baby is not emptying your breasts completely for some reason, use a breast pump to empty your breasts.  If you go back to work, pump your breasts while at work to stay in time with your nursing schedule.  Avoid allowing your breasts to become overly filled with milk (engorged). SEEK MEDICAL CARE IF:   You have pus-like discharge from the breast.  Your symptoms do not improve with the treatment prescribed by your health care provider within 2 days. SEEK IMMEDIATE MEDICAL CARE IF:   Your pain and swelling are getting worse.  You have pain that is not controlled with medicine.  You have a red line extending from the breast toward your armpit.  You have a fever or persistent symptoms for more than 2 3 days.  You have a fever and your symptoms suddenly get worse. Document Released: 07/13/2005 Document Revised: 05/03/2013 Document Reviewed: 02/10/2013 [facility] Patient Information 2014 [place], [place].

## 2013-09-27 NOTE — ED Provider Notes (Signed)
CSN: [id number]     Arrival date & time 09/27/13  4098 History   First MD Initiated Contact with Patient 09/27/13 0820     Chief Complaint  Patient presents with  . Breast Pain  . Oral Swelling   HPI Katherine Weber is a 28 y.o. female, 7 months postpartum, breast-feeding mother, presented to the ED with bilateral breast pain of 3 days. Patient reports that it appears to be worse at night and with breast-feeding. Right side more painful than left side. Breast is painful even to light touch. Her right breast had some mild bleeding from the nipple yesterday. She rates her pain as 6/10 and describes it as a shooting pain that radiates to her nipple. She has breast fed 3 children and reports she has never felt for experienced anything like this prior. She denies fever. Patient endorses redness around nipple. She denies current yeast infection or thrush in child. She has tried landing on ointment and other creams to help nipple irritation none of which were effective. She did question that her child had a thrush infection last week. In addition the child also had a upper respiratory infection last week.  Patient has also had upper lip swelling since pregnancy. She states it's worse when she wakes up in the morning. It feels to her she denies itchiness. She does state that it is large enough that it'll crack. Patient has not tried any antihistamines. Patient reports she has shown her gynecologist and dentist, both of which had nothing to offer patient.  Past Medical History  Diagnosis Date  . Post partum depression   . Thrombocytopenia complicating pregnancy 2011   Past Surgical History  Procedure Laterality Date  . Hand surgery     Family History  Problem Relation Age of Onset  . Cancer Maternal Grandmother     Bone and lung  . Diabetes Father   . Hypertension Father   . Heart disease Father   . SIDS Brother    History  Substance Use Topics  . Smoking status: Current Every Day Smoker -- 0.50  packs/day for 15 years    Types: Cigarettes  . Smokeless tobacco: Never Used  . Alcohol Use: No   OB History   Grav Para Term Preterm Abortions TAB SAB Ect Mult Living   3 3 3       3      Review of Systems  Constitutional: Negative for fever, activity change, appetite change, fatigue and unexpected weight change.  HENT: Negative for sore throat.   Genitourinary: Negative for dysuria, vaginal discharge, difficulty urinating and vaginal pain.      Allergies  Review of patient's allergies indicates no known allergies.  Home Medications   Current Outpatient Rx  Name  Route  Sig  Dispense  Refill  . albuterol (PROVENTIL HFA;VENTOLIN HFA) 108 (90 BASE) MCG/ACT inhaler   Inhalation   Inhale 1-2 puffs into the lungs every 6 (six) hours as needed for wheezing.   1 Inhaler   0   . cephALEXin (KEFLEX) 500 MG capsule   Oral   Take 1 capsule (500 mg total) by mouth 4 (four) times daily.   40 capsule   0   . ibuprofen (ADVIL,MOTRIN) 600 MG tablet   Oral   Take 1 tablet (600 mg total) by mouth every 6 (six) hours.   30 tablet   1    BP 110/70  Pulse 78  Temp(Src) 98.2 F (36.8 C) (Oral)  Resp 16  Ht 5\' 7"  (1.702 m)  Wt 170 lb (77.111 kg)  BMI 26.62 kg/m2  SpO2 100%  Breastfeeding? Yes Physical Exam Gen: NAD.  HEENT: AT. [place]. Swollen upper lip. No trauma, thrush or angioedema.  Bilateral eyes without injections or icterus. MMM.  CV: RRR. [place] Chest: CTAB, no wheeze or crackles Abd: Soft. NTND. BS present. Skin: Bilateral nipple irritation, erythema with dry shiny skin changes on nipple only, does not have erythema on breast. Small crack in right nipple.  ED Course  Procedures (including critical care time) Labs Review Labs Reviewed - No data to display Imaging Review No results found.   EKG Interpretation None      MDM   Final diagnoses:  Yeast infection of nipple, postpartum   Patient presented to the ED today with bilateral breast pain with skin  changes consistent with Candida infection. Patient given Diflucan during ED visit. Prescription given for Keflex x10 days to cover staph. Patient advised to have pediatrician rule out thrush in child. She is also advised to call her obstetrician for advice on antihistamines during lactation for her upper lip swelling. Patient was advised to take Motrin for pain. Continue using lanolin ointment. Followup with PCP within 1 week.    Natalia LeatherwoodRenee A Palmer Fahrner, DO 09/27/13 [id number]

## 2013-09-27 NOTE — ED Provider Notes (Signed)
I saw and evaluated the patient, reviewed the resident's note and I agree with the findings and plan.   EKG Interpretation None      Patient seen by me. Patient with bilateral breast eructation not consistent with mastitis. Irritations mostly around just the nipple area. Clinically suggestive of perhaps a yeast infection. Will treat with Diflucan and cover for the non-nurse bacterial infection with Keflex which will be safe during breast-feeding. Patient also will be treated with Motrin for the inflammation and the discomfort and will continue use the barrier-type ointments to help heal the area. Patient will return for any newer worse symptoms.  Shelda JakesScott W. Xiadani Damman, MD 09/27/13 [phone]

## 2013-09-27 NOTE — ED Notes (Signed)
Pt is here due to pain in bilateral nipples, right greater than left.  Pt states that her nipples are very sore and pain is increased when nursing her 49month old.  Pt also has notable swelling to upper lip which has been going on for 49months (since the birth of her baby) and the swelling has been increasing.  Lip feels numb (no pain) and has been numb and feels thick for 49months, increased when she wakes up.  Pt states that her nursling may have had some thrush symptoms last week but feels fine

## 2013-10-22 ENCOUNTER — Encounter (HOSPITAL_BASED_OUTPATIENT_CLINIC_OR_DEPARTMENT_OTHER): Payer: Self-pay | Admitting: Emergency Medicine

## 2013-10-22 ENCOUNTER — Emergency Department (HOSPITAL_BASED_OUTPATIENT_CLINIC_OR_DEPARTMENT_OTHER)
Admission: EM | Admit: 2013-10-22 | Discharge: 2013-10-22 | Disposition: A | Discharge status: Home / Self Care | Payer: Medicaid Other | Attending: Emergency Medicine | Admitting: Emergency Medicine

## 2013-10-22 DIAGNOSIS — F17200 Nicotine dependence, unspecified, uncomplicated: Secondary | ICD-10-CM | POA: Insufficient documentation

## 2013-10-22 DIAGNOSIS — H109 Unspecified conjunctivitis: Secondary | ICD-10-CM

## 2013-10-22 MED ORDER — TETRACAINE HCL 0.5 % OP SOLN
OPHTHALMIC | Status: AC
  Filled 2013-10-22: qty 2

## 2013-10-22 MED ORDER — POLYMYXIN B-TRIMETHOPRIM 10000-0.1 UNIT/ML-% OP SOLN: 1.0000 [drp] | OPHTHALMIC | Status: DC

## 2013-10-22 MED ORDER — FLUORESCEIN SODIUM 1 MG OP STRP
ORAL_STRIP | OPHTHALMIC | Status: AC
  Filled 2013-10-22: qty 1

## 2013-10-22 NOTE — ED Provider Notes (Signed)
CSN: [id number]     Arrival date & time 10/22/13  1103 History   First MD Initiated Contact with Patient 10/22/13 1125     Chief Complaint  Patient presents with  . Eye Problem     (Consider location/radiation/quality/duration/timing/severity/associated sxs/prior Treatment) HPI 28 year old female with progressively worsening right eye redness, pain, and itchiness. She states there's been no trauma to her eye. She does not wear contacts. She's not have any blurry vision. She's been waking up with matted eyes with greenish/brown discharge. Denies fevers or chills. Has had a lobe of rhinorrhea but denies any other symptoms. She's concerned she may have pink eye. Rates the pain as mild at this time.  Past Medical History  Diagnosis Date  . Post partum depression   . Thrombocytopenia complicating pregnancy 2011   Past Surgical History  Procedure Laterality Date  . Hand surgery     Family History  Problem Relation Age of Onset  . Cancer Maternal Grandmother     Bone and lung  . Diabetes Father   . Hypertension Father   . Heart disease Father   . SIDS Brother    History  Substance Use Topics  . Smoking status: Current Every Day Smoker -- 0.50 packs/day for 15 years    Types: Cigarettes  . Smokeless tobacco: Never Used  . Alcohol Use: No   OB History   Grav Para Term Preterm Abortions TAB SAB Ect Mult Living   3 3 3       3      Review of Systems  Constitutional: Negative for fever.  HENT: Positive for rhinorrhea.   Eyes: Positive for photophobia, pain, discharge, redness and itching. Negative for visual disturbance.  Respiratory: Negative for cough and shortness of breath.   Gastrointestinal: Negative for vomiting.  All other systems reviewed and are negative.      Allergies  Codeine  Home Medications   Current Outpatient Rx  Name  Route  Sig  Dispense  Refill  . trimethoprim-polymyxin b (POLYTRIM) ophthalmic solution   Right Eye   Place 1 drop into the right  eye every 4 (four) hours.   10 mL   0     For 7 days    BP 112/66  Pulse 90  Temp(Src) 97.6 F (36.4 C) (Oral)  Resp 16  Ht 5\' 8"  (1.727 m)  Wt 170 lb (77.111 kg)  BMI 25.85 kg/m2  SpO2 98% Physical Exam  Vitals reviewed. Constitutional: She is oriented to person, place, and time. She appears well-developed and well-nourished. No distress.  HENT:  Head: Normocephalic and atraumatic.  Right Ear: External ear normal.  Left Ear: External ear normal.  Nose: Nose normal.  Eyes: EOM are normal. Pupils are equal, round, and reactive to light. Right eye exhibits no discharge. Left eye exhibits no discharge. Right conjunctiva is injected. Left conjunctiva is not injected. Right eye exhibits no nystagmus. Left eye exhibits no nystagmus.  Mild to moderate injected conjunctivae on the right. Mild erythema surrounding her eye. No drainage. No swelling  Neck: Neck supple.  Cardiovascular: Normal rate, regular rhythm and normal heart sounds.   Pulmonary/Chest: Effort normal and breath sounds normal. She has no wheezes. She has no rales.  Abdominal: She exhibits no distension.  Neurological: She is alert and oriented to person, place, and time.  Skin: Skin is warm and dry.    ED Course  Procedures (including critical care time) Labs Review Labs Reviewed - No data to display Imaging Review No  results found.   EKG Interpretation None      MDM   Final diagnoses:  Conjunctivitis, right eye    Her visual acuity was tested by the nurses and noted to be 20/13 in both eyes. Patient denies any visual changes. Her exam is consistent with mild to moderate conjunctivitis. Given her description of green discharge, we'll cover with Polytrim to cover for bacterial conjunctivitis. She's our contacts and is not immunosuppressed. We'll have her followup with an ophthalmologist if her symptoms do not improve.    Audree Camel, MD 10/22/13 1520

## 2013-10-22 NOTE — Discharge Instructions (Signed)

## 2013-10-22 NOTE — ED Notes (Signed)
Right eye irritation, redness since two days ago.  Some drainage and crustiness in am.  Some visual changes in right eye.  No known fever.

## 2014-05-28 ENCOUNTER — Encounter (HOSPITAL_BASED_OUTPATIENT_CLINIC_OR_DEPARTMENT_OTHER): Payer: Self-pay | Admitting: Emergency Medicine

## 2016-10-13 ENCOUNTER — Encounter: Payer: Self-pay | Admitting: Obstetrics & Gynecology

## 2016-10-13 ENCOUNTER — Ambulatory Visit (INDEPENDENT_AMBULATORY_CARE_PROVIDER_SITE_OTHER): Payer: 59 | Admitting: Obstetrics & Gynecology

## 2016-10-13 VITALS — BP 119/79 | HR 78 | Ht 66.0 in | Wt 138.0 lb

## 2016-10-13 DIAGNOSIS — Z01419 Encounter for gynecological examination (general) (routine) without abnormal findings: Secondary | ICD-10-CM

## 2016-10-13 DIAGNOSIS — Z0000 Encounter for general adult medical examination without abnormal findings: Secondary | ICD-10-CM | POA: Diagnosis not present

## 2016-10-13 NOTE — Progress Notes (Signed)
   Subjective:    Patient ID: Katherine Weber, female    DOB: 05-29-1986, 31 y.o.   MRN: [id number]  HPI 31 yo MW P3 here with the issue of irregular bleeding with her Mirena. This Mirena was placed 2014. When she first got the Mirena, she very rarely had any bleeding. For the last month she has had some bleeding about every 3 days along with cramps.   Review of Systems Prior to using Mirena, she only had a period about once per year, but she weighed about 200# during that time. 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

## 2016-10-14 ENCOUNTER — Telehealth: Payer: Self-pay | Admitting: *Deleted

## 2016-10-14 ENCOUNTER — Encounter (HOSPITAL_COMMUNITY): Payer: Self-pay

## 2016-10-14 LAB — TSH: TSH: 0.76 m[IU]/L

## 2016-10-14 NOTE — Telephone Encounter (Signed)
Pt called to let Dr Marice Potterove know that her insurance requires that she have 3 K for her BTS.  She states that she cannot afford that at this time.

## 2016-10-15 LAB — CYTOLOGY - PAP
CHLAMYDIA, DNA PROBE: NEGATIVE
Diagnosis: NEGATIVE
HPV (WINDOPATH): NOT DETECTED
Neisseria Gonorrhea: NEGATIVE

## 2016-11-20 ENCOUNTER — Ambulatory Visit: Admit: 2016-11-20 | Payer: 59 | Admitting: Obstetrics & Gynecology

## 2016-11-20 SURGERY — SALPINGECTOMY, BILATERAL, LAPAROSCOPIC
Anesthesia: Choice | Laterality: Bilateral

## 2016-12-25 HISTORY — PX: BREAST ENHANCEMENT SURGERY: SHX7

## 2017-12-25 HISTORY — PX: ABDOMINOPLASTY: SUR9

## 2018-02-24 ENCOUNTER — Ambulatory Visit (INDEPENDENT_AMBULATORY_CARE_PROVIDER_SITE_OTHER): Payer: 59 | Admitting: Obstetrics & Gynecology

## 2018-02-24 ENCOUNTER — Encounter (HOSPITAL_COMMUNITY): Payer: Self-pay

## 2018-02-24 ENCOUNTER — Encounter: Payer: Self-pay | Admitting: Obstetrics & Gynecology

## 2018-02-24 VITALS — BP 120/73 | HR 78 | Resp 16 | Ht 67.0 in | Wt 152.0 lb

## 2018-02-24 DIAGNOSIS — Z3009 Encounter for other general counseling and advice on contraception: Secondary | ICD-10-CM | POA: Diagnosis not present

## 2018-02-24 NOTE — Progress Notes (Signed)
History:  32 y.o. [id number] here today for sterilization consult.  Pt has had  'tummy tuck' but, no other abd surgeries.  Pt has a sedentray position at work. Married.   The following portions of the patient's history were reviewed and updated as appropriate: allergies, current medications, past family history, past medical history, past social history, past surgical history and problem list.  Review of Systems:  Pertinent items are noted in HPI.    Objective:  Physical Exam Blood pressure 120/73, pulse 78, resp. rate 16, height 5\' 7"  (1.702 m), weight 152 lb (68.9 kg).  CONSTITUTIONAL: Well-developed, well-nourished female in no acute distress.  HENT:  Normocephalic, atraumatic EYES: Conjunctivae and EOM are normal. No scleral icterus.  NECK: Normal range of motion SKIN: Skin is warm and dry. No rash noted. Not diaphoretic.No pallor. NEUROLGIC: Alert and oriented to person, place, and time. Normal coordination.  Abd:  ND, pt has a well healed abdominoplasty incision. Her navel has been replaced after the abdominoplasty and has an incision surrounding it.      Labs and Imaging No results found.  Assessment & Plan:  Patient desires surgical management with bilateral tubal ligation. iI have explained to her that due to her abdominoplasty, the incision would be above the new umbilicus. Pt is aware and did not care about the location of her incision. The risks of surgery were discussed in detail with the patient including but not limited to: increased risk of ectopic pregnancy should a pregnancy occur; failure rate of 3-10/998; bleeding which may require transfusion or reoperation; infection which may require prolonged hospitalization or re-hospitalization and antibiotic therapy; injury to bowel, bladder, ureters and major vessels or other surrounding organs; need for additional procedures including laparotomy; thromboembolic phenomenon, incisional problems and other postoperative or anesthesia  complications.  The postoperative expectations were also discussed in detail. The patient also understands the alternative treatment options which were discussed in full. All questions were answered.  She was told that she will be contacted by our surgical scheduler regarding the time and date of her surgery; routine preoperative instructions of having nothing to eat or drink after midnight on the day prior to surgery and also coming to the hospital 1 1/2 hours prior to her time of surgery were also emphasized.  She was told she may be called for a preoperative appointment about a week prior to surgery and will be given further preoperative instructions at that visit. Printed patient education handouts about the procedure were given to the patient to review at home.  Total face-to-face time with patient was 20 min.  Greater than 50% was spent in counseling and coordination of care with the patient.   Jaquanna Ballentine L. Harraway-Smith, M.D., Evern CoreFACOG   \

## 2018-02-24 NOTE — Patient Instructions (Signed)
What You Need to Know About Female Sterilization Female sterilization is surgery to prevent pregnancy. In this surgery, the fallopian tubes are either blocked or closed off. This prevents eggs from reaching the uterus so that the eggs cannot be fertilized by sperm and you cannot get pregnant. Sterilization is permanent. It should only be done if you are sure that you do not want to be able to have children. What are the sterilization surgery options? There are several kinds of female sterilization surgeries. They include:  Laparoscopic tubal ligation. In this surgery, the fallopian tubes are tied off, sealed with heat, or blocked with a clip, ring, or clamp. A small portion of each fallopian tube may also be removed. This surgery is done through several small cuts (incisions).  Postpartum tubal ligation. This is also called a mini-laparotomy. This surgery is done right after childbirth or 1 or 2 days after childbirth. In this surgery, the fallopian tubes are tied off, sealed with heat, or blocked with a clip, ring, or clamp. A small portion of each fallopian tube may also be removed. The surgery is done through a single incision.  Hysteroscopic sterilization. In this surgery, a tiny, spring-like coil is inserted through the cervix and uterus into the fallopian tubes. The coil causes scarring, which blocks the tubes. After the surgery, contraception should be used for 3 months to allow the scar tissue to form completely.  Is sterilization safe? Generally, sterilization is safe. Complications are rare. However, there are risks. They include:  Bleeding.  Infection.  Reaction to medicine used during the procedure.  Injury to surrounding organs.  Failure of the procedure.  How effective is sterilization? Sterilization is nearly 100% effective, but it can fail. Also, the fallopian tubes can grow back together over time. If this happens, you will be able to get pregnant again. Women who have had  this procedure have a higher chance of having an ectopic pregnancy. An ectopic pregnancy is a pregnancy that happens outside of the uterus. This kind of pregnancy is unsuccessful and can lead to serious bleeding if it is not treated. What are the benefits?  It is usually effective for a lifetime.  It is usually safe.  It does not have the drawbacks of other types of birth control: That means: ? Your hormones are not affected. Because of this, your menstrual periods, sexual desire, and sexual performance will not be affected. ? There are no side effects. What are the drawbacks?  If you change your mind and decide that you want to have children, you may not be able to. Sterilization may be reversed, but a reversal is not always successful.  It does not provide protection against STDs (sexually transmitted diseases).  It increases the chance of having an ectopic pregnancy. This information is not intended to replace advice given to you by your health care provider. Make sure you discuss any questions you have with your health care provider. Document Released: 12/30/2007 Document Revised: 03/05/2016 Document Reviewed: 04/09/2015 Elsevier Interactive Patient Education  2018 Elsevier Inc.  

## 2018-02-27 ENCOUNTER — Encounter: Payer: Self-pay | Admitting: Obstetrics & Gynecology

## 2018-03-03 ENCOUNTER — Encounter: Payer: Self-pay | Admitting: *Deleted

## 2018-03-17 ENCOUNTER — Encounter (HOSPITAL_COMMUNITY): Payer: Self-pay

## 2018-03-23 ENCOUNTER — Ambulatory Visit (HOSPITAL_BASED_OUTPATIENT_CLINIC_OR_DEPARTMENT_OTHER): Admit: 2018-03-23 | Payer: 59 | Admitting: Obstetrics & Gynecology

## 2018-03-23 ENCOUNTER — Encounter (HOSPITAL_BASED_OUTPATIENT_CLINIC_OR_DEPARTMENT_OTHER): Payer: Self-pay

## 2018-03-23 SURGERY — LIGATION, FALLOPIAN TUBE, LAPAROSCOPIC
Anesthesia: Choice

## 2018-04-13 ENCOUNTER — Other Ambulatory Visit: Payer: Self-pay

## 2018-04-13 ENCOUNTER — Encounter (HOSPITAL_BASED_OUTPATIENT_CLINIC_OR_DEPARTMENT_OTHER): Payer: Self-pay | Admitting: *Deleted

## 2018-04-13 NOTE — Progress Notes (Addendum)
Spoke w/ pt via phone for pre-op interview.  Npo after mn w/ exception clear liquids (no cream/milk products).  Needs urine preg.  Will take colace am dos w/ sips of water.   Getting cbc done Friday 04-15-2018 @ 0830.

## 2018-04-15 ENCOUNTER — Encounter (HOSPITAL_COMMUNITY)
Admission: RE | Admit: 2018-04-15 | Discharge: 2018-04-15 | Disposition: A | Discharge status: Home / Self Care | Payer: 59 | Source: Ambulatory Visit | Attending: Obstetrics & Gynecology | Admitting: Obstetrics & Gynecology

## 2018-04-15 DIAGNOSIS — F17210 Nicotine dependence, cigarettes, uncomplicated: Secondary | ICD-10-CM | POA: Diagnosis not present

## 2018-04-15 DIAGNOSIS — Z8249 Family history of ischemic heart disease and other diseases of the circulatory system: Secondary | ICD-10-CM | POA: Diagnosis not present

## 2018-04-15 DIAGNOSIS — Z79899 Other long term (current) drug therapy: Secondary | ICD-10-CM | POA: Diagnosis not present

## 2018-04-15 DIAGNOSIS — Z30432 Encounter for removal of intrauterine contraceptive device: Secondary | ICD-10-CM | POA: Diagnosis not present

## 2018-04-15 DIAGNOSIS — Z302 Encounter for sterilization: Secondary | ICD-10-CM | POA: Diagnosis not present

## 2018-04-15 DIAGNOSIS — F329 Major depressive disorder, single episode, unspecified: Secondary | ICD-10-CM | POA: Diagnosis not present

## 2018-04-15 DIAGNOSIS — Z885 Allergy status to narcotic agent status: Secondary | ICD-10-CM | POA: Diagnosis not present

## 2018-04-15 LAB — CBC
HEMATOCRIT: 37.7 % (ref 36.0–46.0)
HEMOGLOBIN: 13 g/dL (ref 12.0–15.0)
MCH: 34.6 pg — AB (ref 26.0–34.0)
MCHC: 34.5 g/dL (ref 30.0–36.0)
MCV: 100.3 fL — AB (ref 78.0–100.0)
Platelets: 192 10*3/uL (ref 150–400)
RBC: 3.76 MIL/uL — ABNORMAL LOW (ref 3.87–5.11)
RDW: 13 % (ref 11.5–15.5)
WBC: 9.8 10*3/uL (ref 4.0–10.5)

## 2018-04-19 ENCOUNTER — Encounter (HOSPITAL_BASED_OUTPATIENT_CLINIC_OR_DEPARTMENT_OTHER)
Admission: RE | Disposition: A | Discharge status: Home / Self Care | Payer: Self-pay | Source: Ambulatory Visit | Attending: Obstetrics & Gynecology

## 2018-04-19 ENCOUNTER — Other Ambulatory Visit: Payer: Self-pay

## 2018-04-19 ENCOUNTER — Emergency Department (HOSPITAL_COMMUNITY)
Admission: EM | Admit: 2018-04-19 | Discharge: 2018-04-19 | Disposition: A | Discharge status: Home / Self Care | Payer: 59 | Source: Home / Self Care | Attending: Emergency Medicine | Admitting: Emergency Medicine

## 2018-04-19 ENCOUNTER — Ambulatory Visit (HOSPITAL_BASED_OUTPATIENT_CLINIC_OR_DEPARTMENT_OTHER): Payer: 59 | Admitting: Anesthesiology

## 2018-04-19 ENCOUNTER — Encounter (HOSPITAL_COMMUNITY): Payer: Self-pay | Admitting: Emergency Medicine

## 2018-04-19 ENCOUNTER — Encounter (HOSPITAL_BASED_OUTPATIENT_CLINIC_OR_DEPARTMENT_OTHER): Payer: Self-pay | Admitting: *Deleted

## 2018-04-19 ENCOUNTER — Ambulatory Visit (HOSPITAL_BASED_OUTPATIENT_CLINIC_OR_DEPARTMENT_OTHER)
Admission: RE | Admit: 2018-04-19 | Discharge: 2018-04-19 | Disposition: A | Discharge status: Home / Self Care | Payer: 59 | Source: Ambulatory Visit | Attending: Obstetrics & Gynecology | Admitting: Obstetrics & Gynecology

## 2018-04-19 DIAGNOSIS — Z5189 Encounter for other specified aftercare: Secondary | ICD-10-CM

## 2018-04-19 DIAGNOSIS — F17210 Nicotine dependence, cigarettes, uncomplicated: Secondary | ICD-10-CM | POA: Insufficient documentation

## 2018-04-19 DIAGNOSIS — Z8249 Family history of ischemic heart disease and other diseases of the circulatory system: Secondary | ICD-10-CM | POA: Insufficient documentation

## 2018-04-19 DIAGNOSIS — T8131XA Disruption of external operation (surgical) wound, not elsewhere classified, initial encounter: Secondary | ICD-10-CM | POA: Insufficient documentation

## 2018-04-19 DIAGNOSIS — Z4800 Encounter for change or removal of nonsurgical wound dressing: Secondary | ICD-10-CM | POA: Insufficient documentation

## 2018-04-19 DIAGNOSIS — Z30432 Encounter for removal of intrauterine contraceptive device: Secondary | ICD-10-CM | POA: Diagnosis not present

## 2018-04-19 DIAGNOSIS — Z79899 Other long term (current) drug therapy: Secondary | ICD-10-CM | POA: Insufficient documentation

## 2018-04-19 DIAGNOSIS — Y829 Unspecified medical devices associated with adverse incidents: Secondary | ICD-10-CM | POA: Insufficient documentation

## 2018-04-19 DIAGNOSIS — Z885 Allergy status to narcotic agent status: Secondary | ICD-10-CM | POA: Insufficient documentation

## 2018-04-19 DIAGNOSIS — Z302 Encounter for sterilization: Secondary | ICD-10-CM | POA: Diagnosis not present

## 2018-04-19 DIAGNOSIS — F329 Major depressive disorder, single episode, unspecified: Secondary | ICD-10-CM | POA: Insufficient documentation

## 2018-04-19 HISTORY — DX: Other constipation: K59.09

## 2018-04-19 HISTORY — DX: Major depressive disorder, single episode, unspecified: F32.9

## 2018-04-19 HISTORY — DX: Personal history of diseases of the blood and blood-forming organs and certain disorders involving the immune mechanism: Z86.2

## 2018-04-19 HISTORY — PX: LAPAROSCOPIC TUBAL LIGATION: SHX1937

## 2018-04-19 HISTORY — DX: Depression, unspecified: F32.A

## 2018-04-19 LAB — POCT PREGNANCY, URINE: Preg Test, Ur: NEGATIVE

## 2018-04-19 SURGERY — LIGATION, FALLOPIAN TUBE, LAPAROSCOPIC
Anesthesia: General | Laterality: Bilateral

## 2018-04-19 MED ORDER — ROCURONIUM BROMIDE 10 MG/ML (PF) SYRINGE
PREFILLED_SYRINGE | INTRAVENOUS | Status: DC | PRN
  Administered 2018-04-19: 50 mg via INTRAVENOUS

## 2018-04-19 MED ORDER — PROMETHAZINE HCL 25 MG/ML IJ SOLN
6.2500 mg | INTRAMUSCULAR | Status: DC | PRN
  Filled 2018-04-19: qty 1

## 2018-04-19 MED ORDER — ONDANSETRON HCL 4 MG/2ML IJ SOLN
INTRAMUSCULAR | Status: DC | PRN
  Administered 2018-04-19: 4 mg via INTRAVENOUS

## 2018-04-19 MED ORDER — IBUPROFEN 800 MG PO TABS: 800.0000 mg | ORAL_TABLET | Freq: Three times a day (TID) | ORAL | 0 refills | Status: AC | PRN

## 2018-04-19 MED ORDER — LIDOCAINE 2% (20 MG/ML) 5 ML SYRINGE
INTRAMUSCULAR | Status: DC | PRN
  Administered 2018-04-19: 60 mg via INTRAVENOUS

## 2018-04-19 MED ORDER — LACTATED RINGERS IV SOLN
INTRAVENOUS | Status: DC
  Administered 2018-04-19 (×2): via INTRAVENOUS
  Filled 2018-04-19: qty 1000

## 2018-04-19 MED ORDER — HYDROMORPHONE HCL 1 MG/ML IJ SOLN
0.2500 mg | INTRAMUSCULAR | Status: DC | PRN
  Filled 2018-04-19: qty 0.5

## 2018-04-19 MED ORDER — PROPOFOL 10 MG/ML IV BOLUS
INTRAVENOUS | Status: AC
  Filled 2018-04-19: qty 20

## 2018-04-19 MED ORDER — LACTATED RINGERS IV SOLN
INTRAVENOUS | Status: DC
  Filled 2018-04-19: qty 1000

## 2018-04-19 MED ORDER — OXYCODONE HCL 5 MG PO TABS
ORAL_TABLET | ORAL | Status: AC
  Filled 2018-04-19: qty 1

## 2018-04-19 MED ORDER — MIDAZOLAM HCL 2 MG/2ML IJ SOLN
INTRAMUSCULAR | Status: AC
  Filled 2018-04-19: qty 2

## 2018-04-19 MED ORDER — MEPERIDINE HCL 25 MG/ML IJ SOLN
6.2500 mg | INTRAMUSCULAR | Status: DC | PRN
  Filled 2018-04-19: qty 1

## 2018-04-19 MED ORDER — FENTANYL CITRATE (PF) 100 MCG/2ML IJ SOLN
INTRAMUSCULAR | Status: DC | PRN
  Administered 2018-04-19 (×2): 50 ug via INTRAVENOUS

## 2018-04-19 MED ORDER — OXYCODONE HCL 5 MG PO TABS
5.0000 mg | ORAL_TABLET | Freq: Once | ORAL | Status: AC | PRN
  Administered 2018-04-19: 5 mg via ORAL
  Filled 2018-04-19: qty 1

## 2018-04-19 MED ORDER — BUPIVACAINE HCL 0.5 % IJ SOLN
INTRAMUSCULAR | Status: DC | PRN
  Administered 2018-04-19: 30 mL

## 2018-04-19 MED ORDER — FENTANYL CITRATE (PF) 100 MCG/2ML IJ SOLN
INTRAMUSCULAR | Status: AC
  Filled 2018-04-19: qty 2

## 2018-04-19 MED ORDER — PROPOFOL 10 MG/ML IV BOLUS
INTRAVENOUS | Status: DC | PRN
  Administered 2018-04-19: 150 mg via INTRAVENOUS

## 2018-04-19 MED ORDER — DEXAMETHASONE SODIUM PHOSPHATE 10 MG/ML IJ SOLN
INTRAMUSCULAR | Status: DC | PRN
  Administered 2018-04-19: 10 mg via INTRAVENOUS

## 2018-04-19 MED ORDER — MIDAZOLAM HCL 2 MG/2ML IJ SOLN
INTRAMUSCULAR | Status: DC | PRN
  Administered 2018-04-19: 2 mg via INTRAVENOUS

## 2018-04-19 MED ORDER — SUGAMMADEX SODIUM 200 MG/2ML IV SOLN
INTRAVENOUS | Status: DC | PRN
  Administered 2018-04-19: 200 mg via INTRAVENOUS

## 2018-04-19 MED ORDER — KETOROLAC TROMETHAMINE 30 MG/ML IJ SOLN
30.0000 mg | Freq: Once | INTRAMUSCULAR | Status: DC | PRN
  Filled 2018-04-19: qty 1

## 2018-04-19 MED ORDER — OXYCODONE HCL 5 MG/5ML PO SOLN
5.0000 mg | Freq: Once | ORAL | Status: AC | PRN
  Filled 2018-04-19: qty 5

## 2018-04-19 MED ORDER — KETOROLAC TROMETHAMINE 30 MG/ML IJ SOLN
INTRAMUSCULAR | Status: DC | PRN
  Administered 2018-04-19: 30 mg via INTRAVENOUS

## 2018-04-19 MED ORDER — FENTANYL CITRATE (PF) 100 MCG/2ML IJ SOLN
25.0000 ug | INTRAMUSCULAR | Status: DC | PRN
  Administered 2018-04-19 (×2): 50 ug via INTRAVENOUS
  Filled 2018-04-19: qty 1

## 2018-04-19 SURGICAL SUPPLY — 26 items
CATH ROBINSON RED A/P 16FR (CATHETERS) ×3 IMPLANT
CLIP FILSHIE TUBAL LIGA STRL (Clip) ×3 IMPLANT
DERMABOND ADVANCED (GAUZE/BANDAGES/DRESSINGS) ×2
DERMABOND ADVANCED .7 DNX12 (GAUZE/BANDAGES/DRESSINGS) ×1 IMPLANT
DRSG OPSITE POSTOP 3X4 (GAUZE/BANDAGES/DRESSINGS) IMPLANT
DURAPREP 26ML APPLICATOR (WOUND CARE) ×3 IMPLANT
GLOVE BIO SURGEON STRL SZ7 (GLOVE) ×3 IMPLANT
GLOVE BIOGEL PI IND STRL 7.0 (GLOVE) ×3 IMPLANT
GLOVE BIOGEL PI INDICATOR 7.0 (GLOVE) ×6
GOWN STRL REUS W/TWL LRG LVL3 (GOWN DISPOSABLE) ×3 IMPLANT
GOWN STRL REUS W/TWL XL LVL3 (GOWN DISPOSABLE) ×3 IMPLANT
MANIPULATOR UTERINE 4.5 ZUMI (MISCELLANEOUS) ×3 IMPLANT
NEEDLE INSUFFLATION 120MM (ENDOMECHANICALS) ×3 IMPLANT
PACK LAPAROSCOPY BASIN (CUSTOM PROCEDURE TRAY) ×3 IMPLANT
PACK TRENDGUARD 450 HYBRID PRO (MISCELLANEOUS) IMPLANT
PROTECTOR NERVE ULNAR (MISCELLANEOUS) ×6 IMPLANT
SUT VIC AB 3-0 PS2 18 (SUTURE) ×2
SUT VIC AB 3-0 PS2 18XBRD (SUTURE) ×1 IMPLANT
SUT VICRYL 0 UR6 27IN ABS (SUTURE) ×3 IMPLANT
SYR 10ML LL (SYRINGE) ×3 IMPLANT
TOWEL OR 17X24 6PK STRL BLUE (TOWEL DISPOSABLE) ×6 IMPLANT
TRENDGUARD 450 HYBRID PRO PACK (MISCELLANEOUS)
TROCAR XCEL DIL TIP R 11M (ENDOMECHANICALS) ×3 IMPLANT
TROCAR XCEL NON-BLD 5MMX100MML (ENDOMECHANICALS) IMPLANT
TUBING INSUF HEATED (TUBING) ×3 IMPLANT
WARMER LAPAROSCOPE (MISCELLANEOUS) ×3 IMPLANT

## 2018-04-19 NOTE — ED Triage Notes (Signed)
Patient had a tubal ligation this am. Patient incision glue has became unglued. Patient is bleeding at belly button at the site. Patient is not having any pain. Patient is feeling nauseas. 

## 2018-04-19 NOTE — Anesthesia Postprocedure Evaluation (Signed)
Anesthesia Post Note  Patient: Katherine CampanileDonna M Weber  Procedure(s) Performed: LAPAROSCOPIC TUBAL LIGATION WITH FILSHIE CLIPS, IUD REMOVAL (Bilateral )     Patient location during evaluation: PACU Anesthesia Type: General Level of consciousness: awake and alert Pain management: pain level controlled Vital Signs Assessment: post-procedure vital signs reviewed and stable Respiratory status: spontaneous breathing, nonlabored ventilation, respiratory function stable and patient connected to nasal cannula oxygen Cardiovascular status: blood pressure returned to baseline and stable Postop Assessment: no apparent nausea or vomiting Anesthetic complications: no    Last Vitals:  Vitals:   04/19/18 1515 04/19/18 1530  BP: 111/73 128/80  Pulse: 60 62  Resp: 13 15  Temp:    SpO2: 100% 100%    Last Pain:  Vitals:   04/19/18 1545  TempSrc:   PainSc: 8                  Reinhart Saulters S

## 2018-04-19 NOTE — Anesthesia Procedure Notes (Signed)
Procedure Name: Intubation Date/Time: 04/19/2018 2:02 PM Performed by: Wanita Chamberlain, CRNA Pre-anesthesia Checklist: Patient identified, Timeout performed, Emergency Drugs available, Suction available and Patient being monitored Patient Re-evaluated:Patient Re-evaluated prior to induction Oxygen Delivery Method: Circle system utilized Preoxygenation: Pre-oxygenation with 100% oxygen Induction Type: IV induction Ventilation: Mask ventilation without difficulty Laryngoscope Size: Mac and 3 Grade View: Grade I Tube type: Oral Tube size: 7.0 mm Number of attempts: 1 Airway Equipment and Method: Bite block Placement Confirmation: CO2 detector,  positive ETCO2,  ETT inserted through vocal cords under direct vision and breath sounds checked- equal and bilateral Secured at: 22 cm Tube secured with: Tape Dental Injury: Teeth and Oropharynx as per pre-operative assessment

## 2018-04-19 NOTE — Op Note (Addendum)
04/19/2018  2:47 PM  PATIENT:  Katherine Weber  32 y.o. female  PRE-OPERATIVE DIAGNOSIS:  Undesired Fertility  POST-OPERATIVE DIAGNOSIS:  Undesired Fertility  PROCEDURE:  Procedure(s): LAPAROSCOPIC TUBAL LIGATION WITH FILSHIE CLIPS, IUD REMOVAL (Bilateral)  SURGEON:  Surgeon(s) and Role:    * Willodean RosenthalHarraway-Smith, Azarius Lambson, MD - Primary  ANESTHESIA:   local and general  EBL:  5 mL   BLOOD ADMINISTERED:none  DRAINS: none   LOCAL MEDICATIONS USED:  MARCAINE     SPECIMEN:  No Specimen  DISPOSITION OF SPECIMEN:  N/A  COUNTS:  YES  TOURNIQUET:  * No tourniquets in log *  DICTATION: .Note written in EPIC  PLAN OF CARE: Discharge to home after PACU  PATIENT DISPOSITION:  PACU - hemodynamically stable.   Delay start of Pharmacological VTE agent (>24hrs) due to surgical blood loss or risk of bleeding: not applicable  Complications: none immediate  INDICATIONS: 32 y.o. [id number]  with undesired fertility, desires permanent sterilization. Other reversible forms of contraception were discussed with patient; she declines all other modalities.  Risks of procedure discussed with patient including permanence of method, bleeding, infection, injury to surrounding organs and need for additional procedures including laparotomy, risk of regret.  Failure risk of 3-10/998 with increased risk of ectopic gestation if pregnancy occurs was also discussed with patient.      FINDINGS:  Normal uterus, tubes, and ovaries.  TECHNIQUE:  The patient was taken to the operating room where general anesthesia was obtained without difficulty.  She was then placed in the dorsal lithotomy position and prepared and draped in sterile fashion.  After an adequate timeout was performed, a bivalved speculum was then placed in the patient's vagina, and the anterior lip of cervix grasped with the single-tooth tenaculum. The IUD strings were noted. Using Ringed forceps, the strings of the IUD were grasped and the IUD was removed  in its entirety.   A uterine manipulator was then advanced into the uterus.  The speculum was removed from the vagina.    Attention was then turned to the patient's abdomen where a 11-mm skin incision was made above the umbilical fold (of note, the patient had a prior abdominoplasty and her navel was removed and replaced).  An 11-mm trocar and sleeve were then advanced without difficulty into the abdomen where intraabdominal placement was confirmed by the operative laparoscope. Intraperitoneal placement was confirmed by drop in intraabdominal pressure with insufflation of carbon dioxide gas.  Adequate pneumoperitoneum was obtained, and  survey of the patient's pelvis and abdomen revealed entirely normal anatomy.  The fallopian tubes were observed and found to be normal in appearance. The Filshie clip applicator was placed through the operative port, and a Filshie clip was placed on the right fallopian tube ,about 2 cm from the cornual attachment, with care given to incorporate the underlying mesosalpinx.  A similar process was carried out on the contralateral side allowing for bilateral tubal sterilization.   Good hemostasis was noted overall.  Local analgesia was drizzled on both operative sites.The instruments were then removed from the patient's abdomen and the fascial incision was repaired with 0 Vicryl, and the skin was closed with 3-0 vicryl and Dermabond.  The uterine manipulator and the tenaculum were removed from the vagina without complications. The patient tolerated the procedure well.  Sponge, lap, and needle counts were correct times two.  The patient was then taken to the recovery room awake, extubated and in stable condition.  Katherine Weber, M.D., Evern CoreFACOG

## 2018-04-19 NOTE — Transfer of Care (Signed)
Immediate Anesthesia Transfer of Care Note  Patient: Katherine CampanileDonna M Weber  Procedure(s) Performed: LAPAROSCOPIC TUBAL LIGATION WITH FILSHIE CLIPS, IUD REMOVAL (Bilateral )  Patient Location: PACU  Anesthesia Type:General  Level of Consciousness: awake, alert , oriented and patient cooperative  Airway & Oxygen Therapy: Patient Spontanous Breathing and Patient connected to nasal cannula oxygen  Post-op Assessment: Report given to RN and Post -op Vital signs reviewed and stable  Post vital signs: Reviewed and stable  Last Vitals:  Vitals Value Taken Time  BP    Temp    Pulse 71 04/19/2018  2:54 PM  Resp 12 04/19/2018  2:54 PM  SpO2 100 % 04/19/2018  2:54 PM  Vitals shown include unvalidated device data.  Last Pain:  Vitals:   04/19/18 1158  TempSrc:   PainSc: 0-No pain      Patients Stated Pain Goal: 8 (04/19/18 1158)  Complications: No apparent anesthesia complications

## 2018-04-19 NOTE — ED Provider Notes (Signed)
[facility]-EMERGENCY DEPT Provider Note   CSN: [id number] Arrival date & time: 04/19/18  2056     History   Chief Complaint Chief Complaint  Patient presents with  . Post-op Problem    HPI Katherine Weber is a 32 y.o. female.  HPI Patient presented to the emergency room for evaluation of bleeding from her surgical wound.  Patient had a tubal ligation today.  When the patient was discharged home she noticed that the Dermabond glued peeled off and she had some bleeding from the wound.  She contacted the surgeon on call and was told to come to the emergency room to be evaluated.  Patient denies any pain or other complications. Past Medical History:  Diagnosis Date  . Chronic constipation   . Depression   . History of thrombocytopenia 2011   during pregnancy    Patient Active Problem List   Diagnosis Date Noted  . Encounter for sterilization 05/08/2013    Past Surgical History:  Procedure Laterality Date  . ABDOMINOPLASTY  12/2017   "tummy tuck"  . BREAST ENHANCEMENT SURGERY Bilateral 12/2016     OB History    Gravida  3   Para  3   Term  3   Preterm      AB      Living  3     SAB      TAB      Ectopic      Multiple      Live Births  3            Home Medications    Prior to Admission medications   Medication Sig Start Date End Date Taking? Authorizing Provider  amitriptyline (ELAVIL) 100 MG tablet Take 100 mg by mouth at bedtime.    [provider]  docusate sodium (COLACE) 100 MG capsule Take 100 mg by mouth 2 (two) times daily.    [provider]  ibuprofen (ADVIL,MOTRIN) 800 MG tablet Take 1 tablet (800 mg total) by mouth every 8 (eight) hours as needed. 04/19/18   Willodean RosenthalHarraway-Smith, Carolyn, MD  lamoTRIgine (LAMICTAL) 100 MG tablet Take 100 mg by mouth daily.    [provider]  Multiple Vitamin (MULTIVITAMIN) tablet Take 1 tablet by mouth daily.    [provider]    Family  History Family History  Problem Relation Age of Onset  . Cancer Maternal Grandmother        Bone and lung  . Diabetes Father   . Hypertension Father   . Heart disease Father   . SIDS Brother     Social History Social History   Tobacco Use  . Smoking status: Current Every Day Smoker    Packs/day: 0.25    Years: 18.00    Pack years: 4.50    Types: Cigarettes  . Smokeless tobacco: Never Used  . Tobacco comment: 04-13-2018  per pt trying to quit down to 3 cig per day from 1/2ppd  Substance Use Topics  . Alcohol use: Yes    Comment: socially  . Drug use: Not Currently     Allergies   Codeine   Review of Systems Review of Systems  All other systems reviewed and are negative.    Physical Exam Updated Vital Signs BP 131/83 (BP Location: Right Arm)   Pulse 78   Temp 98.3 F (36.8 C) (Oral)   Resp 18   Ht 1.676 m (5\' 6" )   Wt 61.2 kg   SpO2 100%  BMI 21.79 kg/m   Physical Exam  Constitutional: She appears well-developed and well-nourished. No distress.  HENT:  Head: Normocephalic and atraumatic.  Right Ear: External ear normal.  Left Ear: External ear normal.  Eyes: Conjunctivae are normal. Right eye exhibits no discharge. Left eye exhibits no discharge. No scleral icterus.  Neck: Neck supple. No tracheal deviation present.  Cardiovascular: Normal rate.  Pulmonary/Chest: Effort normal. No stridor. No respiratory distress.  Abdominal: She exhibits no distension.  Patient has approximately 1 inch surgical wound above the umbilicus, the Dermabond has peeled off, there is no evidence of wound dehiscence  Musculoskeletal: She exhibits no edema.  Neurological: She is alert. Cranial nerve deficit: no gross deficits.  Skin: Skin is warm and dry. No rash noted.  Psychiatric: She has a normal mood and affect.  Nursing note and vitals reviewed.    ED Treatments / Results   Procedures Procedures (including critical care time) Dermabond was reapplied to the  patient's surgical wound.   Medications Ordered in ED Medications - No data to display   Initial Impression / Assessment and Plan / ED Course  I have reviewed the triage vital signs and the nursing notes.  Pertinent labs & imaging results that were available during my care of the patient were reviewed by me and considered in my medical decision making (see chart for details).   Patient presented for a wound check.  She does not have any evidence of dehiscence.  Dermabond was reapplied to the wound.  Patient is stable for outpatient follow-up  Final Clinical Impressions(s) / ED Diagnoses   Final diagnoses:  Visit for wound check    ED Discharge Orders    None       Linwood Dibbles, MD 04/19/18 2325

## 2018-04-19 NOTE — Brief Op Note (Signed)
04/19/2018  2:47 PM  PATIENT:  Katherine Weber  32 y.o. female  PRE-OPERATIVE DIAGNOSIS:  Undesired Fertility  POST-OPERATIVE DIAGNOSIS:  Undesired Fertility  PROCEDURE:  Procedure(s): LAPAROSCOPIC TUBAL LIGATION WITH FILSHIE CLIPS, IUD REMOVAL (Bilateral)  SURGEON:  Surgeon(s) and Role:    * Willodean RosenthalHarraway-Smith, Tempest Frankland, MD - Primary  ANESTHESIA:   local and general  EBL:  5 mL   BLOOD ADMINISTERED:none  DRAINS: none   LOCAL MEDICATIONS USED:  MARCAINE     SPECIMEN:  No Specimen  DISPOSITION OF SPECIMEN:  N/A  COUNTS:  YES  TOURNIQUET:  * No tourniquets in log *  DICTATION: .Note written in EPIC  PLAN OF CARE: Discharge to home after PACU  PATIENT DISPOSITION:  PACU - hemodynamically stable.   Delay start of Pharmacological VTE agent (>24hrs) due to surgical blood loss or risk of bleeding: not applicable  Complications: none immediate  Katherine Weber, M.D., Evern CoreFACOG

## 2018-04-19 NOTE — Discharge Instructions (Addendum)
Keep a dressing over the wound, follow up with your doctor as planned

## 2018-04-19 NOTE — Anesthesia Preprocedure Evaluation (Addendum)
Anesthesia Evaluation  Patient identified by MRN, date of birth, ID band Patient awake    Reviewed: Allergy & Precautions, NPO status , Patient's Chart, lab work & pertinent test results  Airway Mallampati: I  TM Distance: >3 FB Neck ROM: Full    Dental  (+) Partial Lower, Dental Advisory Given   Pulmonary Current Smoker,    breath sounds clear to auscultation       Cardiovascular negative cardio ROS   Rhythm:Regular Rate:Normal     Neuro/Psych Depression    GI/Hepatic negative GI ROS, Neg liver ROS,   Endo/Other  negative endocrine ROS  Renal/GU negative Renal ROS     Musculoskeletal negative musculoskeletal ROS (+)   Abdominal Normal abdominal exam  (+)   Peds  Hematology negative hematology ROS (+)   Anesthesia Other Findings   Reproductive/Obstetrics                            Anesthesia Physical Anesthesia Plan  ASA: II  Anesthesia Plan: General   Post-op Pain Management:    Induction: Intravenous  PONV Risk Score and Plan: 3 and Ondansetron, Dexamethasone and Midazolam  Airway Management Planned: Oral ETT  Additional Equipment: None  Intra-op Plan:   Post-operative Plan: Extubation in OR  Informed Consent: I have reviewed the patients History and Physical, chart, labs and discussed the procedure including the risks, benefits and alternatives for the proposed anesthesia with the patient or authorized representative who has indicated his/her understanding and acceptance.   Dental advisory given  Plan Discussed with: CRNA  Anesthesia Plan Comments:        Anesthesia Quick Evaluation

## 2018-04-19 NOTE — Discharge Instructions (Signed)
Laparoscopic Tubal Ligation, Care After °Refer to this sheet in the next few weeks. These instructions provide you with information about caring for yourself after your procedure. Your health care provider may also give you more specific instructions. Your treatment has been planned according to current medical practices, but problems sometimes occur. Call your health care provider if you have any problems or questions after your procedure. °What can I expect after the procedure? °After the procedure, it is common to have: °· A sore throat. °· Discomfort in your shoulder. °· Mild discomfort or cramping in your abdomen. °· Gas pains. °· Pain or soreness in the area where the surgical cut (incision) was made. °· A bloated feeling. °· Tiredness. °· Nausea. °· Vomiting. ° °Follow these instructions at home: °Medicines °· Take over-the-counter and prescription medicines only as told by your health care provider. °· Do not take aspirin because it can cause bleeding. °· Do not drive or operate heavy machinery while taking prescription pain medicine. °Activity °· Rest for the rest of the day. °· Return to your normal activities as told by your health care provider. Ask your health care provider what activities are safe for you. °Incision care ° °· Follow instructions from your health care provider about how to take care of your incision. Make sure you: °? Wash your hands with soap and water before you change your bandage (dressing). If soap and water are not available, use hand sanitizer. °? Change your dressing as told by your health care provider. °? Leave stitches (sutures) in place. They may need to stay in place for 2 weeks or longer. °· Check your incision area every day for signs of infection. Check for: °? More redness, swelling, or pain. °? More fluid or blood. °? Warmth. °? Pus or a bad smell. °Other Instructions °· Do not take baths, swim, or use a hot tub until your health care provider approves. You may take  showers. °· Keep all follow-up visits as told by your health care provider. This is important. °· Have someone help you with your daily household tasks for the first few days. °Contact a health care provider if: °· You have more redness, swelling, or pain around your incision. °· Your incision feels warm to the touch. °· You have pus or a bad smell coming from your incision. °· The edges of your incision break open after the sutures have been removed. °· Your pain does not improve after 2-3 days. °· You have a rash. °· You repeatedly become dizzy or light-headed. °· Your pain medicine is not helping. °· You are constipated. °Get help right away if: °· You have a fever. °· You faint. °· You have increasing pain in your abdomen. °· You have severe pain in one or both of your shoulders. °· You have fluid or blood coming from your sutures or from your vagina. °· You have shortness of breath or difficulty breathing. °· You have chest pain or leg pain. °· You have ongoing nausea, vomiting, or diarrhea. °This information is not intended to replace advice given to you by your health care provider. Make sure you discuss any questions you have with your health care provider. °Document Released: 01/30/2005 Document Revised: 12/16/2015 Document Reviewed: 06/23/2015 °Elsevier Interactive Patient Education © 2018 [employer] ° ° °Post Anesthesia Home Care Instructions ° °Activity: °Get plenty of rest for the remainder of the day. A responsible individual must stay with you for 24 hours following the procedure.  °For the   next 24 hours, DO NOT: °-Drive a car °-Operate machinery °-Drink alcoholic beverages °-Take any medication unless instructed by your physician °-Make any legal decisions or sign important papers. ° °Meals: °Start with liquid foods such as gelatin or soup. Progress to regular foods as tolerated. Avoid greasy, spicy, heavy foods. If nausea and/or vomiting occur, drink only clear liquids until the nausea and/or  vomiting subsides. Call your physician if vomiting continues. ° °Special Instructions/Symptoms: °Your throat may feel dry or sore from the anesthesia or the breathing tube placed in your throat during surgery. If this causes discomfort, gargle with warm salt water. The discomfort should disappear within 24 hours. ° °If you had a scopolamine patch placed behind your ear for the management of post- operative nausea and/or vomiting: ° °1. The medication in the patch is effective for 72 hours, after which it should be removed.  Wrap patch in a tissue and discard in the trash. Wash hands thoroughly with soap and water. °2. You may remove the patch earlier than 72 hours if you experience unpleasant side effects which may include dry mouth, dizziness or visual disturbances. °3. Avoid touching the patch. Wash your hands with soap and water after contact with the patch. °  ° °

## 2018-04-19 NOTE — H&P (Signed)
Preoperative History and Physical  Katherine Weber is a 32 y.o. [phone] here for surgical management of undesired fertility   Proposed surgery: laparoscopic bilateral tubal ligation  Past Medical History:  Diagnosis Date  . Chronic constipation   . Depression   . History of thrombocytopenia 2011   during pregnancy   Past Surgical History:  Procedure Laterality Date  . ABDOMINOPLASTY  12/2017   "tummy tuck"  . BREAST ENHANCEMENT SURGERY Bilateral 12/2016   OB History    Gravida  3   Para  3   Term  3   Preterm      AB      Living  3     SAB      TAB      Ectopic      Multiple      Live Births  3          Patient denies any cervical dysplasia or STIs. No medications prior to admission.    Allergies  Allergen Reactions  . Codeine Nausea And Vomiting and Rash   Social History:   reports that she has been smoking cigarettes. She has a 4.50 pack-year smoking history. She has never used smokeless tobacco. She reports that she drinks alcohol. She reports that she has current or past drug history. Family History  Problem Relation Age of Onset  . Cancer Maternal Grandmother        Bone and lung  . Diabetes Father   . Hypertension Father   . Heart disease Father   . SIDS Brother     Review of Systems: Noncontributory  PHYSICAL EXAM: Height 5\' 7"  (1.702 m), weight 65.8 kg. General appearance - alert, well appearing, and in no distress Chest - clear to auscultation, no wheezes, rales or rhonchi, symmetric air entry Heart - normal rate and regular rhythm Abdomen - soft, nontender, nondistended, no masses or organomegaly Pelvic - examination not indicated Extremities - peripheral pulses normal, no pedal edema, no clubbing or cyanosis  Labs: Results for orders placed or performed during the hospital encounter of 04/19/18 (from the past 336 hour(s))  CBC   Collection Time: 04/15/18  8:50 AM  Result Value Ref Range   WBC 9.8 4.0 - 10.5 K/uL   RBC 3.76 (L)  3.87 - 5.11 MIL/uL   Hemoglobin 13.0 12.0 - 15.0 g/dL   HCT 45.4 09.8 - 11.9 %   MCV 100.3 (H) 78.0 - 100.0 fL   MCH 34.6 (H) 26.0 - 34.0 pg   MCHC 34.5 30.0 - 36.0 g/dL   RDW 14.7 82.9 - 56.2 %   Platelets 192 150 - 400 K/uL    Imaging Studies: No results found.  Assessment: Patient Active Problem List   Diagnosis Date Noted  . Encounter for sterilization 05/08/2013    Plan: Patient will undergo surgical management with laparoscopic bilateral tubal ligation.    The risks of surgery were discussed in detail with the patient including but not limited to: bleeding which may require transfusion or reoperation; infection which may require antibiotics; injury to surrounding organs which may involve bowel, bladder, ureters ; need for additional procedures including laparoscopy or laparotomy; thromboembolic phenomenon, surgical site problems and other postoperative/anesthesia complications. Likelihood of success in alleviating the patient's condition was discussed. Routine postoperative instructions will be reviewed with the patient and her family in detail after surgery.  The patient concurred with the proposed plan, giving informed written consent for the surgery.  Patient has been NPO since last  night she will remain NPO for procedure.  Anesthesia and OR aware.  Preoperative prophylactic antibiotics and SCDs ordered on call to the OR.  To OR when ready.  Graham Hyun L. Erin FullingHarraway-Smith, M.D., [facility] 04/19/2018 9:57 AM

## 2018-04-20 ENCOUNTER — Encounter (HOSPITAL_BASED_OUTPATIENT_CLINIC_OR_DEPARTMENT_OTHER): Payer: Self-pay | Admitting: Obstetrics & Gynecology

## 2018-04-25 ENCOUNTER — Telehealth: Payer: Self-pay | Admitting: *Deleted

## 2018-04-25 NOTE — Telephone Encounter (Signed)
Appt made for PO on 05/12/18 @ 4:15 with Dr Marice Potter.

## 2018-04-25 NOTE — Telephone Encounter (Signed)
-----   Message from Willodean Rosenthal, MD sent at 04/19/2018  2:57 PM EDT ----- Please schedule pt 2 week post op check.  Any provider. Not sure when I'll be out there next.   Cant find the admin pool for [facility] and cant remember Keisha's last name!  Thx, clh-S

## 2018-05-12 ENCOUNTER — Encounter: Payer: 59 | Admitting: Obstetrics & Gynecology

## 2018-05-30 ENCOUNTER — Encounter: Payer: 59 | Admitting: Obstetrics & Gynecology

## 2018-05-30 NOTE — Progress Notes (Deleted)
   Patient did not show up today for her scheduled appointment.   Amaar Oshita, MD, FACOG Obstetrician & Gynecologist, [facility], [facility]  

## 2019-11-02 ENCOUNTER — Encounter (HOSPITAL_BASED_OUTPATIENT_CLINIC_OR_DEPARTMENT_OTHER): Payer: Self-pay

## 2019-11-02 ENCOUNTER — Other Ambulatory Visit: Payer: Self-pay

## 2019-11-02 ENCOUNTER — Emergency Department (HOSPITAL_BASED_OUTPATIENT_CLINIC_OR_DEPARTMENT_OTHER)
Admission: EM | Admit: 2019-11-02 | Discharge: 2019-11-02 | Disposition: A | Discharge status: Home / Self Care | Payer: 59 | Attending: Emergency Medicine | Admitting: Emergency Medicine

## 2019-11-02 DIAGNOSIS — F17210 Nicotine dependence, cigarettes, uncomplicated: Secondary | ICD-10-CM | POA: Insufficient documentation

## 2019-11-02 DIAGNOSIS — Z79899 Other long term (current) drug therapy: Secondary | ICD-10-CM | POA: Insufficient documentation

## 2019-11-02 DIAGNOSIS — Z885 Allergy status to narcotic agent status: Secondary | ICD-10-CM | POA: Insufficient documentation

## 2019-11-02 DIAGNOSIS — R509 Fever, unspecified: Secondary | ICD-10-CM | POA: Insufficient documentation

## 2019-11-02 DIAGNOSIS — N12 Tubulo-interstitial nephritis, not specified as acute or chronic: Secondary | ICD-10-CM

## 2019-11-02 DIAGNOSIS — N10 Acute tubulo-interstitial nephritis: Secondary | ICD-10-CM | POA: Insufficient documentation

## 2019-11-02 LAB — COMPREHENSIVE METABOLIC PANEL
ALT: 17 U/L (ref 0–44)
AST: 25 U/L (ref 15–41)
Albumin: 4.1 g/dL (ref 3.5–5.0)
Alkaline Phosphatase: 61 U/L (ref 38–126)
Anion gap: 11 (ref 5–15)
BUN: 12 mg/dL (ref 6–20)
CO2: 22 mmol/L (ref 22–32)
Calcium: 8.8 mg/dL — ABNORMAL LOW (ref 8.9–10.3)
Chloride: 101 mmol/L (ref 98–111)
Creatinine, Ser: 0.58 mg/dL (ref 0.44–1.00)
GFR calc Af Amer: >60 mL/min (ref >60)
GFR calc non Af Amer: >60 mL/min (ref >60)
Glucose, Bld: 120 mg/dL — ABNORMAL HIGH (ref 70–99)
Potassium: 3.7 mmol/L (ref 3.5–5.1)
Sodium: 134 mmol/L — ABNORMAL LOW (ref 135–145)
Total Bilirubin: 0.9 mg/dL (ref 0.3–1.2)
Total Protein: 7 g/dL (ref 6.5–8.1)

## 2019-11-02 LAB — CBC WITH DIFFERENTIAL/PLATELET
Abs Immature Granulocytes: 0.06 K/uL (ref 0.00–0.07)
Basophils Absolute: 0 K/uL (ref 0.0–0.1)
Basophils Relative: 0 %
Eosinophils Absolute: 0.1 K/uL (ref 0.0–0.5)
Eosinophils Relative: 1 %
HCT: 33.5 % — ABNORMAL LOW (ref 36.0–46.0)
Hemoglobin: 11.7 g/dL — ABNORMAL LOW (ref 12.0–15.0)
Immature Granulocytes: 1 %
Lymphocytes Relative: 8 %
Lymphs Abs: 1.1 K/uL (ref 0.7–4.0)
MCH: 36.1 pg — ABNORMAL HIGH (ref 26.0–34.0)
MCHC: 34.9 g/dL (ref 30.0–36.0)
MCV: 103.4 fL — ABNORMAL HIGH (ref 80.0–100.0)
Monocytes Absolute: 0.6 K/uL (ref 0.1–1.0)
Monocytes Relative: 5 %
Neutro Abs: 11.2 K/uL — ABNORMAL HIGH (ref 1.7–7.7)
Neutrophils Relative %: 85 %
Platelets: 153 K/uL (ref 150–400)
RBC: 3.24 MIL/uL — ABNORMAL LOW (ref 3.87–5.11)
RDW: 12.4 % (ref 11.5–15.5)
WBC: 13 K/uL — ABNORMAL HIGH (ref 4.0–10.5)
nRBC: 0 % (ref 0.0–0.2)

## 2019-11-02 LAB — URINALYSIS, ROUTINE W REFLEX MICROSCOPIC
Bilirubin Urine: NEGATIVE
Glucose, UA: NEGATIVE mg/dL
Ketones, ur: NEGATIVE mg/dL
Nitrite: POSITIVE — AB
Protein, ur: 30 mg/dL — AB
Specific Gravity, Urine: 1.01 (ref 1.005–1.030)
pH: 7.5 (ref 5.0–8.0)

## 2019-11-02 LAB — URINALYSIS, MICROSCOPIC (REFLEX): WBC, UA: >50 WBC/hpf (ref 0–5)

## 2019-11-02 LAB — LIPASE, BLOOD: Lipase: 26 U/L (ref 11–51)

## 2019-11-02 LAB — PREGNANCY, URINE: Preg Test, Ur: NEGATIVE

## 2019-11-02 MED ORDER — KETOROLAC TROMETHAMINE 30 MG/ML IJ SOLN
30.0000 mg | Freq: Once | INTRAMUSCULAR | Status: AC
  Administered 2019-11-02: 30 mg via INTRAVENOUS
  Filled 2019-11-02: qty 1

## 2019-11-02 MED ORDER — ONDANSETRON 4 MG PO TBDP: 4.0000 mg | ORAL_TABLET | Freq: Three times a day (TID) | ORAL | 0 refills | Status: AC | PRN

## 2019-11-02 MED ORDER — SODIUM CHLORIDE 0.9 % IV BOLUS
1000.0000 mL | Freq: Once | INTRAVENOUS | Status: AC
  Administered 2019-11-02: 22:00:00 1000 mL via INTRAVENOUS

## 2019-11-02 MED ORDER — SODIUM CHLORIDE 0.9 % IV SOLN
1.0000 g | Freq: Once | INTRAVENOUS | Status: AC
  Administered 2019-11-02: 22:00:00 1 g via INTRAVENOUS
  Filled 2019-11-02: qty 10

## 2019-11-02 MED ORDER — SULFAMETHOXAZOLE-TRIMETHOPRIM 800-160 MG PO TABS: 1.0000 | ORAL_TABLET | Freq: Two times a day (BID) | ORAL | 0 refills | Status: AC

## 2019-11-02 MED ORDER — ONDANSETRON HCL 4 MG/2ML IJ SOLN
4.0000 mg | Freq: Once | INTRAMUSCULAR | Status: AC
  Administered 2019-11-02: 4 mg via INTRAVENOUS
  Filled 2019-11-02: qty 2

## 2019-11-02 NOTE — Discharge Instructions (Signed)
Please pick up medication and take as prescribed. It is important to finish the entire course to properly treat your kidney infection Take the anti nausea medication as needed. You can take 800 mg Ibuprofen every 8 hours as needed for pain (Do not exceed 3,000 mg per day).  Drink plenty of fluids to stay hydrated. Follow up with your PCP - if you do not have one you can follow up with [facility] for primary care needs Return to the ED for any worsening symptoms including worsening pain after 48 hours on antibiotics or no improvement after 48 hours, excessive vomiting, dizziness/lightheadedness, or any other concerning symptoms

## 2019-11-02 NOTE — ED Provider Notes (Signed)
[facility] EMERGENCY DEPARTMENT Provider Note   CSN: [id number] Arrival date & time: 11/02/19  1921     History Chief Complaint  Patient presents with  . Flank Pain    Katherine Weber is a 34 y.o. female with PMHx chronic constipation who presents to the ED today with complaint of gradual onset, constant, sharp, bilateral flank pain x 3 days. Pt also complains of subjective fevers, chills, nausea, and NBNB emesis. She reports she has been having frequent urination however drinks a lot of water and does not seem more than normal. Pt has been taking Tylenol with mild relief. No recent sick contacts or COVID 19 positive exposure. Denies chest pain, SOB, diarrhea, constipation, pelvic pain, vaginal discharge, or any other associated symptoms.   The history is provided by the patient and medical records.       Past Medical History:  Diagnosis Date  . Chronic constipation   . Depression   . History of thrombocytopenia 2011   during pregnancy    Patient Active Problem List   Diagnosis Date Noted  . Encounter for sterilization 05/08/2013    Past Surgical History:  Procedure Laterality Date  . ABDOMINOPLASTY  12/2017   "tummy tuck"  . BREAST ENHANCEMENT SURGERY Bilateral 12/2016  . LAPAROSCOPIC TUBAL LIGATION Bilateral 04/19/2018   Procedure: LAPAROSCOPIC TUBAL LIGATION WITH FILSHIE CLIPS, IUD REMOVAL;  Surgeon: Willodean Rosenthal, MD;  Location: [facility];  Service: Gynecology;  Laterality: Bilateral;     OB History    Gravida  3   Para  3   Term  3   Preterm      AB      Living  3     SAB      TAB      Ectopic      Multiple      Live Births  3           Family History  Problem Relation Age of Onset  . Cancer Maternal Grandmother        Bone and lung  . Diabetes Father   . Hypertension Father   . Heart disease Father   . SIDS Brother     Social History   Tobacco Use  . Smoking status: Current Every Day Smoker    Packs/day: 0.25    Years: 18.00    Pack years: 4.50    Types: Cigarettes  . Smokeless tobacco: Never Used  Substance Use Topics  . Alcohol use: Yes    Comment: socially  . Drug use: Not Currently    Home Medications Prior to Admission medications   Medication Sig Start Date End Date Taking? Authorizing Provider  amphetamine-dextroamphetamine (ADDERALL) 20 MG tablet TAKE 1 TABLET BY MOUTH IN THE MORNING 1 AT NOON AND 1 2 (ONE HALF) IN THE EVENING AS NEEDED FOR FOCUS 10/26/19  Yes [provider]  diazepam (VALIUM) 10 MG tablet Take 10 mg by mouth every 8 (eight) hours as needed. 05/22/19  Yes [provider]  amitriptyline (ELAVIL) 100 MG tablet Take 100 mg by mouth at bedtime.    [provider]  docusate sodium (COLACE) 100 MG capsule Take 100 mg by mouth 2 (two) times daily.    [provider]  ibuprofen (ADVIL,MOTRIN) 800 MG tablet Take 1 tablet (800 mg total) by mouth every 8 (eight) hours as needed. 04/19/18   Willodean Rosenthal, MD  lamoTRIgine (LAMICTAL) 100 MG tablet Take 100 mg by mouth daily.  [provider]  Multiple Vitamin (MULTIVITAMIN) tablet Take 1 tablet by mouth daily.    [provider]  ondansetron (ZOFRAN ODT) 4 MG disintegrating tablet Take 1 tablet (4 mg total) by mouth every 8 (eight) hours as needed for nausea or vomiting. 11/02/19   Alroy Bailiff, Brittie Whisnant, PA-C  sulfamethoxazole-trimethoprim (BACTRIM DS) 800-160 MG tablet Take 1 tablet by mouth 2 (two) times daily for 14 days. 11/02/19 11/16/19  Eustaquio Maize, PA-C    Allergies    Codeine  Review of Systems   Review of Systems  Constitutional: Positive for chills, fatigue and fever.  Respiratory: Negative for cough and shortness of breath.   Cardiovascular: Negative for chest pain.  Gastrointestinal: Positive for nausea and vomiting. Negative for diarrhea.  Genitourinary: Positive for flank pain.  All other systems reviewed and are negative.   Physical  Exam Updated Vital Signs BP 112/71 (BP Location: Right Arm)   Pulse (!) 102   Temp 98.5 F (36.9 C) (Oral)   Resp 18   Ht 5\' 6"  (1.676 m)   Wt 74.8 kg   LMP 10/29/2019   SpO2 98%   BMI 26.60 kg/m   Physical Exam Vitals and nursing note reviewed.  Constitutional:      Appearance: She is not ill-appearing or diaphoretic.  HENT:     Head: Normocephalic and atraumatic.  Eyes:     Conjunctiva/sclera: Conjunctivae normal.  Cardiovascular:     Rate and Rhythm: Regular rhythm. Tachycardia present.  Pulmonary:     Effort: Pulmonary effort is normal.     Breath sounds: Normal breath sounds. No wheezing, rhonchi or rales.  Abdominal:     Palpations: Abdomen is soft.     Tenderness: There is abdominal tenderness. There is right CVA tenderness and left CVA tenderness. There is no guarding or rebound.     Comments: + bilateral CVA and bilateral flank TTP  Musculoskeletal:     Cervical back: Neck supple.  Skin:    General: Skin is warm and dry.  Neurological:     Mental Status: She is alert.     ED Results / Procedures / Treatments   Labs (all labs ordered are listed, but only abnormal results are displayed) Labs Reviewed  URINALYSIS, ROUTINE W REFLEX MICROSCOPIC - Abnormal; Notable for the following components:      Result Value   APPearance CLOUDY (*)    Hgb urine dipstick SMALL (*)    Protein, ur 30 (*)    Nitrite POSITIVE (*)    Leukocytes,Ua MODERATE (*)    All other components within normal limits  URINALYSIS, MICROSCOPIC (REFLEX) - Abnormal; Notable for the following components:   Bacteria, UA FEW (*)    All other components within normal limits  COMPREHENSIVE METABOLIC PANEL - Abnormal; Notable for the following components:   Sodium 134 (*)    Glucose, Bld 120 (*)    Calcium 8.8 (*)    All other components within normal limits  CBC WITH DIFFERENTIAL/PLATELET - Abnormal; Notable for the following components:   WBC 13.0 (*)    RBC 3.24 (*)    Hemoglobin 11.7 (*)     HCT 33.5 (*)    MCV 103.4 (*)    MCH 36.1 (*)    Neutro Abs 11.2 (*)    All other components within normal limits  URINE CULTURE  PREGNANCY, URINE  LIPASE, BLOOD    EKG None  Radiology No results found.  Procedures Procedures (including critical care time)  Medications Ordered in  ED Medications  sodium chloride 0.9 % bolus 1,000 mL (1,000 mLs Intravenous New Bag/Given 11/02/19 2150)  ondansetron (ZOFRAN) injection 4 mg (4 mg Intravenous Given 11/02/19 2151)  ketorolac (TORADOL) 30 MG/ML injection 30 mg (30 mg Intravenous Given 11/02/19 2152)  cefTRIAXone (ROCEPHIN) 1 g in sodium chloride 0.9 % 100 mL IVPB (1 g Intravenous New Bag/Given 11/02/19 2228)    ED Course  I have reviewed the triage vital signs and the nursing notes.  Pertinent labs & imaging results that were available during my care of the patient were reviewed by me and considered in my medical decision making (see chart for details).    MDM Rules/Calculators/A&P                      34 year old female who presents to the ED today complaining of bilateral flank pain for the past 3 days with subjective fevers and chills and nausea, vomiting.  On arrival to the ED patient is tachycardic in the 110s.  She is afebrile nontachypneic.  She has bilateral CVA and flank tenderness to palpation.  No peritoneal signs.  Analysis was collected prior to patient being evaluated, positive for leuks and nitrites consistent with UTI.  With patient's bilateral flank tenderness I am suspicious for pyelonephritis at this time.  Will obtain screening labs, Rocephin for UTI, Toradol and Zofran for symptomatic relief as well as fluids.  CBC with leukocytosis of 13,000. Hgb stable.  CMP with glucose 120 and sodium 134. No other electrolyte abnormalities. Creatinine within normal limits at 0.58.  Lipase 26.   On reeval pt resting comfortably; reports her pain is 90% resolved. She does not want to wait for her fluids to be done running as she is  ready to go home and see her children; feel this is reasonable. Will discharge home at this time. Strict return precautions discussed with pt; she is in agreement with plan and stable for discharge home.   This note was prepared using Dragon voice recognition software and may include unintentional dictation errors due to the inherent limitations of voice recognition software.   Final Clinical Impression(s) / ED Diagnoses Final diagnoses:  Pyelonephritis    Rx / DC Orders ED Discharge Orders         Ordered    sulfamethoxazole-trimethoprim (BACTRIM DS) 800-160 MG tablet  2 times daily     11/02/19 2312    ondansetron (ZOFRAN ODT) 4 MG disintegrating tablet  Every 8 hours PRN     11/02/19 2312           Discharge Instructions     Please pick up medication and take as prescribed. It is important to finish the entire course to properly treat your kidney infection Take the anti nausea medication as needed. You can take 800 mg Ibuprofen every 8 hours as needed for pain (Do not exceed 3,000 mg per day).  Drink plenty of fluids to stay hydrated. Follow up with your PCP - if you do not have one you can follow up with [facility] for primary care needs Return to the ED for any worsening symptoms including worsening pain after 48 hours on antibiotics or no improvement after 48 hours, excessive vomiting, dizziness/lightheadedness, or any other concerning symptoms        Tanda Rockers, PA-C 11/02/19 2314    Geoffery Lyons, MD 11/02/19 2316

## 2019-11-02 NOTE — ED Triage Notes (Signed)
Pt c/o bilat flank pain day 3-states she was sent from [facility]

## 2019-11-05 LAB — URINE CULTURE: Culture: >=100000 — AB

## 2019-11-06 ENCOUNTER — Telehealth: Payer: Self-pay | Admitting: *Deleted

## 2019-11-06 NOTE — Telephone Encounter (Signed)
Post ED Visit - Positive Culture Follow-up  Culture report reviewed by antimicrobial stewardship pharmacist: [facility] []  , Pharm.D. []  Enzo Bi, Pharm.D., BCPS AQ-ID []  , Pharm.D., BCPS []  Celedonio Miyamoto, .D., BCPS []  [place], .D., BCPS, AAHIVP []  Georgina Pillion, Pharm.D., BCPS, AAHIVP []  [address], PharmD, BCPS []  , PharmD, BCPS []  [place], PharmD, BCPS []  [place], PharmD []  , PharmD, BCPS []  Estella Husk, PharmD , Pharm [facility] []  , PharmD []  Phillips Climes, PharmD []  , PharmD []  Agapito Games, Rph []  ) Verlan Friends, PharmD []  , PharmD []  Mervyn Gay, PharmD []  , PharmD []  Vinnie Level, PharmD []  Jettie Pagan, PharmD []  Autumn Patty, PharmD []  , PharmD []  Len Childs, PharmD   Positive urine culture Treated with Sulfamethoxazole, organism sensitive to the same and no further patient follow-up is required at this time.  [facility] 11/06/2019, 10:11 AM

## 2022-11-25 ENCOUNTER — Emergency Department (HOSPITAL_BASED_OUTPATIENT_CLINIC_OR_DEPARTMENT_OTHER)
Admission: EM | Admit: 2022-11-25 | Discharge: 2022-11-25 | Discharge status: Against Medical Advice | Payer: Self-pay | Attending: Emergency Medicine | Admitting: Emergency Medicine

## 2022-11-25 ENCOUNTER — Encounter (HOSPITAL_BASED_OUTPATIENT_CLINIC_OR_DEPARTMENT_OTHER): Payer: Self-pay | Admitting: Radiology

## 2022-11-25 ENCOUNTER — Other Ambulatory Visit: Payer: Self-pay

## 2022-11-25 DIAGNOSIS — T23012A Burn of unspecified degree of left thumb (nail), initial encounter: Secondary | ICD-10-CM | POA: Insufficient documentation

## 2022-11-25 DIAGNOSIS — Z5321 Procedure and treatment not carried out due to patient leaving prior to being seen by health care provider: Secondary | ICD-10-CM | POA: Insufficient documentation

## 2022-11-25 DIAGNOSIS — Y93G3 Activity, cooking and baking: Secondary | ICD-10-CM | POA: Insufficient documentation

## 2022-11-25 DIAGNOSIS — X19XXXA Contact with other heat and hot substances, initial encounter: Secondary | ICD-10-CM | POA: Insufficient documentation

## 2022-11-25 NOTE — ED Triage Notes (Signed)
Pt states on Sunday she burnt it cooking. Pt with a large blister to the top of the left thumb. Pt unable to bend finger due to the blister.

## 2023-01-11 ENCOUNTER — Other Ambulatory Visit: Payer: Self-pay

## 2023-01-11 ENCOUNTER — Emergency Department (HOSPITAL_BASED_OUTPATIENT_CLINIC_OR_DEPARTMENT_OTHER): Payer: Self-pay

## 2023-01-11 ENCOUNTER — Encounter (HOSPITAL_BASED_OUTPATIENT_CLINIC_OR_DEPARTMENT_OTHER): Payer: Self-pay

## 2023-01-11 ENCOUNTER — Emergency Department (HOSPITAL_BASED_OUTPATIENT_CLINIC_OR_DEPARTMENT_OTHER)
Admission: EM | Admit: 2023-01-11 | Discharge: 2023-01-11 | Disposition: A | Discharge status: Home / Self Care | Payer: Self-pay | Attending: Emergency Medicine | Admitting: Emergency Medicine

## 2023-01-11 DIAGNOSIS — R0789 Other chest pain: Secondary | ICD-10-CM | POA: Insufficient documentation

## 2023-01-11 LAB — CBC
HCT: 39.8 % (ref 36.0–46.0)
Hemoglobin: 14.2 g/dL (ref 12.0–15.0)
MCH: 37 pg — ABNORMAL HIGH (ref 26.0–34.0)
MCHC: 35.7 g/dL (ref 30.0–36.0)
MCV: 103.6 fL — ABNORMAL HIGH (ref 80.0–100.0)
Platelets: 214 10*3/uL (ref 150–400)
RBC: 3.84 MIL/uL — ABNORMAL LOW (ref 3.87–5.11)
RDW: 12 % (ref 11.5–15.5)
WBC: 9.6 10*3/uL (ref 4.0–10.5)
nRBC: 0 % (ref 0.0–0.2)

## 2023-01-11 LAB — BASIC METABOLIC PANEL
Anion gap: 9 (ref 5–15)
BUN: 10 mg/dL (ref 6–20)
CO2: 25 mmol/L (ref 22–32)
Calcium: 9.1 mg/dL (ref 8.9–10.3)
Chloride: 102 mmol/L (ref 98–111)
Creatinine, Ser: 0.6 mg/dL (ref 0.44–1.00)
GFR, Estimated: >60 mL/min (ref >60)
Glucose, Bld: 88 mg/dL (ref 70–99)
Potassium: 3.7 mmol/L (ref 3.5–5.1)
Sodium: 136 mmol/L (ref 135–145)

## 2023-01-11 LAB — TROPONIN I (HIGH SENSITIVITY): Troponin I (High Sensitivity): <2 ng/L (ref <18)

## 2023-01-11 NOTE — Discharge Instructions (Addendum)
You have been seen today for your complaint of chest pain. Your lab work was reassuring and showed no abnormalities. Your imaging was reassuring and showed no abnormalities. Follow up with: Your primary care provider in 7 days for reevaluation Please seek immediate medical care if you develop any of the following symptoms: Your chest pain gets worse. You have a cough that gets worse, or you cough up blood. You have severe pain in your abdomen. You faint. You have sudden, unexplained chest discomfort. You have sudden, unexplained discomfort in your arms, back, neck, or jaw. You have shortness of breath at any time. You suddenly start to sweat, or your skin gets clammy. You feel nausea or you vomit. You suddenly feel lightheaded or dizzy. You have severe weakness, or unexplained weakness or fatigue. Your heart begins to beat quickly, or it feels like it is skipping beats. At this time there does not appear to be the presence of an emergent medical condition, however there is always the potential for conditions to change. Please read and follow the below instructions.  Do not take your medicine if  develop an itchy rash, swelling in your mouth or lips, or difficulty breathing; call 911 and seek immediate emergency medical attention if this occurs.  You may review your lab tests and imaging results in their entirety on your MyChart account.  Please discuss all results of fully with your primary care provider and other specialist at your follow-up visit.  Note: Portions of this text may have been transcribed using voice recognition software. Every effort was made to ensure accuracy; however, inadvertent computerized transcription errors may still be present.

## 2023-01-11 NOTE — ED Triage Notes (Signed)
Pt c/o epigastric to sternal chest pain that started last night. Pt describes as a ball in her chest. Pt states she has been waking up in the middle of the night coughing and sweating.

## 2023-01-11 NOTE — ED Provider Notes (Signed)
[facility] [facility] Provider Note   CSN: [id number] Arrival date & time: 01/11/23  1601     History  Chief Complaint  Patient presents with   Chest Pain    Katherine Weber is a 37 y.o. female.  With a history of anxiety, depression who presents the ED for evaluation of chest pain.  She noticed chest pain last night.  Describes it as a pressure.  It is substernal.  Does not radiate.  Not exertional.  No associated nausea, vomiting or diaphoresis.  No cardiac history.  She denies any unilateral leg swelling.  No history of DVT or PE.  No estrogen use.  No recent cancer treatments, surgeries, long distance travel.  Currently does not have any pain.  She has not taken anything for symptoms at home.  No fevers.  No abdominal pain.   Chest Pain      Home Medications Prior to Admission medications   Medication Sig Start Date End Date Taking? Authorizing Provider  amitriptyline (ELAVIL) 100 MG tablet Take 100 mg by mouth at bedtime.    [provider]  amphetamine-dextroamphetamine (ADDERALL) 20 MG tablet TAKE 1 TABLET BY MOUTH IN THE MORNING 1 AT NOON AND 1 2 (ONE HALF) IN THE EVENING AS NEEDED FOR FOCUS 10/26/19   [provider]  diazepam (VALIUM) 10 MG tablet Take 10 mg by mouth every 8 (eight) hours as needed. 05/22/19   [provider]  docusate sodium (COLACE) 100 MG capsule Take 100 mg by mouth 2 (two) times daily.    [provider]  ibuprofen (ADVIL,MOTRIN) 800 MG tablet Take 1 tablet (800 mg total) by mouth every 8 (eight) hours as needed. 04/19/18   Willodean Rosenthal, MD  lamoTRIgine (LAMICTAL) 100 MG tablet Take 100 mg by mouth daily.    [provider]  Multiple Vitamin (MULTIVITAMIN) tablet Take 1 tablet by mouth daily.    [provider]  ondansetron (ZOFRAN ODT) 4 MG disintegrating tablet Take 1 tablet (4 mg total) by mouth every 8 (eight) hours as needed for nausea or vomiting.  11/02/19   Tanda Rockers, PA-C      Allergies    Codeine    Review of Systems   Review of Systems  Cardiovascular:  Positive for chest pain.  All other systems reviewed and are negative.   Physical Exam Updated Vital Signs BP (!) 145/94   Pulse 82   Temp 98 F (36.7 C) (Oral)   Resp 18   Ht 5\' 8"  (1.727 m)   Wt 72.6 kg   LMP 12/28/2022   SpO2 98%   BMI 24.33 kg/m  Physical Exam Vitals and nursing note reviewed.  Constitutional:      General: She is not in acute distress.    Appearance: Normal appearance. She is well-developed and normal weight. She is not ill-appearing.     Comments: Resting comfortably in bed  HENT:     Head: Normocephalic and atraumatic.  Eyes:     Conjunctiva/sclera: Conjunctivae normal.  Cardiovascular:     Rate and Rhythm: Normal rate and regular rhythm.     Heart sounds: No murmur heard. Pulmonary:     Effort: Pulmonary effort is normal. No respiratory distress.     Breath sounds: Normal breath sounds. No decreased breath sounds, wheezing, rhonchi or rales.  Chest:     Comments: No chest TTP or rashes Abdominal:     General: Abdomen is flat.  Palpations: Abdomen is soft.     Tenderness: There is no abdominal tenderness.  Musculoskeletal:        General: No swelling. Normal range of motion.     Cervical back: Neck supple.     Right lower leg: No edema.     Left lower leg: No edema.  Skin:    General: Skin is warm and dry.     Capillary Refill: Capillary refill takes less than 2 seconds.  Neurological:     Mental Status: She is alert and oriented to person, place, and time.  Psychiatric:        Mood and Affect: Mood normal.        Behavior: Behavior normal.     ED Results / Procedures / Treatments   Labs (all labs ordered are listed, but only abnormal results are displayed) Labs Reviewed  CBC - Abnormal; Notable for the following components:      Result Value   RBC 3.84 (*)    MCV 103.6 (*)    MCH 37.0 (*)    All other  components within normal limits  BASIC METABOLIC PANEL  TROPONIN I (HIGH SENSITIVITY)    EKG EKG Interpretation  Date/Time:  Monday January 11 2023 16:09:59 EDT Ventricular Rate:  92 PR Interval:  138 QRS Duration: 95 QT Interval:  355 QTC Calculation: 440 R Axis:   74 Text Interpretation: Sinus rhythm Low voltage, precordial leads Confirmed by Virgina Norfolk ([id number]) on 01/11/2023 7:30:34 PM  Radiology DG Chest 2 View  Result Date: 01/11/2023 CLINICAL DATA:  cp EXAM: CHEST - 2 VIEW COMPARISON:  November 09, 2012 FINDINGS: The cardiomediastinal silhouette is normal in contour. No pleural effusion. No pneumothorax. No acute pleuroparenchymal abnormality. Visualized abdomen is unremarkable. No acute osseous abnormality noted. IMPRESSION: No acute cardiopulmonary abnormality. Electronically Signed   By: Meda Klinefelter M.D.   On: 01/11/2023 17:20    Procedures Procedures    Medications Ordered in ED Medications - No data to display  ED Course/ Medical Decision Making/ A&P             HEART Score: 0                Medical Decision Making Amount and/or Complexity of Data Reviewed Labs: ordered. Radiology: ordered.  This patient presents to the ED for concern of chest pain, this involves an extensive number of treatment options, and is a complaint that carries with it a high risk of complications and morbidity. The emergent differential diagnosis of chest pain includes: Acute coronary syndrome, pericarditis, aortic dissection, pulmonary embolism, tension pneumothorax, and esophageal rupture.  I do not believe the patient has an emergent cause of chest pain, other urgent/non-acute considerations include, but are not limited to: chronic angina, aortic stenosis, cardiomyopathy, myocarditis, mitral valve prolapse, pulmonary hypertension, hypertrophic obstructive cardiomyopathy (HOCM), aortic insufficiency, right ventricular hypertrophy, pneumonia, pleuritis, bronchitis, pneumothorax, tumor,  gastroesophageal reflux disease (GERD), esophageal spasm, Mallory-Weiss syndrome, peptic ulcer disease, biliary disease, pancreatitis, functional gastrointestinal pain, cervical or thoracic disk disease or arthritis, shoulder arthritis, costochondritis, subacromial bursitis, anxiety or panic attack, herpes zoster, breast disorders, chest wall tumors, thoracic outlet syndrome, mediastinitis.   Co morbidities that complicate the patient evaluation  anxiety, depression  My initial workup includes ACS rule out  Additional history obtained from: Nursing notes from this visit.  I ordered, reviewed and interpreted labs which include: BMP, CBC, troponin.  Labs within normal limits  I ordered imaging studies including chest x-ray I independently visualized and  interpreted imaging which showed normal I agree with the radiologist interpretation  Cardiac Monitoring:  The patient was maintained on a cardiac monitor.  I personally viewed and interpreted the cardiac monitored which showed an underlying rhythm of: NSR  Afebrile, hemodynamically stable.  37 year old female presents ED for evaluation of chest pain.  Currently does not have any pain.  She appears very well on physical exam.  EKG without ischemic changes.  Chest x-ray normal.  Initial troponin normal.  Heart score of 0.  Very low suspicion for ACS as the cause of her symptoms.  She is also PERC negative.  Unclear etiology of her symptoms, however very low suspicion for emergent abnormalities.  GERD versus anxiety are high on the differential.  She was encouraged to follow-up with her primary care provider regarding her symptoms.  She was given return precautions.  Stable at discharge.  At this time there does not appear to be any evidence of an acute emergency medical condition and the patient appears stable for discharge with appropriate outpatient follow up. Diagnosis was discussed with patient who verbalizes understanding of care plan and is  agreeable to discharge. I have discussed return precautions with patient who verbalizes understanding. Patient encouraged to follow-up with their PCP within 1 week. All questions answered.  Patient's case discussed with Dr. Lockie Mola who agrees with plan to discharge with follow-up.   Note: Portions of this report may have been transcribed using voice recognition software. Every effort was made to ensure accuracy; however, inadvertent computerized transcription errors may still be present.        Final Clinical Impression(s) / ED Diagnoses Final diagnoses:  Atypical chest pain    Rx / DC Orders ED Discharge Orders     None         Michelle Piper, Cordelia Poche 01/11/23 1938    Virgina Norfolk, DO 01/11/23 2319

## 2024-05-14 ENCOUNTER — Emergency Department (HOSPITAL_BASED_OUTPATIENT_CLINIC_OR_DEPARTMENT_OTHER)
Admission: EM | Admit: 2024-05-14 | Discharge: 2024-05-14 | Discharge status: Against Medical Advice | Payer: Self-pay | Attending: Emergency Medicine | Admitting: Emergency Medicine

## 2024-05-14 ENCOUNTER — Ambulatory Visit: Admission: EM | Admit: 2024-05-14 | Discharge: 2024-05-14 | Discharge status: Against Medical Advice | Payer: Self-pay

## 2024-05-14 ENCOUNTER — Encounter (HOSPITAL_BASED_OUTPATIENT_CLINIC_OR_DEPARTMENT_OTHER): Payer: Self-pay | Admitting: Emergency Medicine

## 2024-05-14 ENCOUNTER — Other Ambulatory Visit: Payer: Self-pay

## 2024-05-14 DIAGNOSIS — N6452 Nipple discharge: Secondary | ICD-10-CM | POA: Insufficient documentation

## 2024-05-14 DIAGNOSIS — Z5321 Procedure and treatment not carried out due to patient leaving prior to being seen by health care provider: Secondary | ICD-10-CM | POA: Insufficient documentation

## 2024-05-14 NOTE — ED Triage Notes (Signed)
 Pt reports discharge from LT nipple on Mon night and Fri night; sts it soaked through to mattress; discharge is clear, but when it dries it looks light green/yellow; denies pain or change in appearance; breast implant surg 1.5 yrs ago
